# Patient Record
Sex: Female | Born: 1965 | Hispanic: No | Marital: Married | Smoking: Never smoker
Health system: Southern US, Community
[De-identification: ages and names within clinical notes are randomized; demographics above are authoritative.]

## PROBLEM LIST (undated history)

## (undated) DIAGNOSIS — I10 Essential (primary) hypertension: Secondary | ICD-10-CM

## (undated) DIAGNOSIS — K219 Gastro-esophageal reflux disease without esophagitis: Secondary | ICD-10-CM

## (undated) DIAGNOSIS — L210 Seborrhea capitis: Secondary | ICD-10-CM

## (undated) DIAGNOSIS — L659 Nonscarring hair loss, unspecified: Secondary | ICD-10-CM

## (undated) DIAGNOSIS — E785 Hyperlipidemia, unspecified: Secondary | ICD-10-CM

## (undated) HISTORY — DX: Essential (primary) hypertension: I10

## (undated) HISTORY — DX: Hyperlipidemia, unspecified: E78.5

## (undated) HISTORY — DX: Nonscarring hair loss, unspecified: L65.9

## (undated) HISTORY — DX: Gastro-esophageal reflux disease without esophagitis: K21.9

---
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
## 2005-11-02 ENCOUNTER — Other Ambulatory Visit: Admission: RE | Admit: 2005-11-02 | Discharge: 2005-11-02 | Payer: Self-pay | Admitting: Obstetrics and Gynecology

## 2007-02-16 ENCOUNTER — Ambulatory Visit (HOSPITAL_COMMUNITY): Admission: RE | Admit: 2007-02-16 | Discharge: 2007-02-16 | Payer: Self-pay | Admitting: Obstetrics & Gynecology

## 2007-07-20 ENCOUNTER — Ambulatory Visit: Payer: Self-pay | Admitting: Obstetrics and Gynecology

## 2007-07-20 ENCOUNTER — Inpatient Hospital Stay (HOSPITAL_COMMUNITY): Admission: AD | Admit: 2007-07-20 | Discharge: 2007-07-20 | Payer: Self-pay | Admitting: Obstetrics and Gynecology

## 2007-07-20 ENCOUNTER — Inpatient Hospital Stay (HOSPITAL_COMMUNITY): Admission: AD | Admit: 2007-07-20 | Discharge: 2007-07-24 | Payer: Self-pay | Admitting: Obstetrics and Gynecology

## 2007-07-21 ENCOUNTER — Encounter: Payer: Self-pay | Admitting: Obstetrics and Gynecology

## 2007-09-05 ENCOUNTER — Ambulatory Visit: Payer: Self-pay | Admitting: Obstetrics & Gynecology

## 2008-07-15 ENCOUNTER — Ambulatory Visit: Payer: Self-pay | Admitting: Family Medicine

## 2008-07-15 LAB — CONVERTED CEMR LAB
BUN: 11 mg/dL (ref 6–23)
CO2: 24 meq/L (ref 19–32)
Calcium: 9.4 mg/dL (ref 8.4–10.5)
Chloride: 104 meq/L (ref 96–112)
Creatinine, Ser: 0.64 mg/dL (ref 0.40–1.20)
Helicobacter Pylori Antibody-IgG: 0.9

## 2008-07-21 ENCOUNTER — Ambulatory Visit: Payer: Self-pay | Admitting: *Deleted

## 2009-01-29 ENCOUNTER — Ambulatory Visit: Payer: Self-pay | Admitting: Family Medicine

## 2009-08-26 ENCOUNTER — Ambulatory Visit (HOSPITAL_COMMUNITY): Admission: RE | Admit: 2009-08-26 | Discharge: 2009-08-26 | Payer: Self-pay | Admitting: Internal Medicine

## 2010-09-01 ENCOUNTER — Ambulatory Visit (HOSPITAL_COMMUNITY): Admission: RE | Admit: 2010-09-01 | Discharge: 2010-09-01 | Payer: Self-pay | Admitting: Internal Medicine

## 2011-02-15 NOTE — Op Note (Signed)
[name], [name]               ACCOUNT NO.:  [id number]   MEDICAL RECORD NO.:  [id number]          PATIENT TYPE:  INP   LOCATION:  9126                          FACILITY:  [facility]   PHYSICIAN:  Lauren Petersen, M.D.     DATE OF BIRTH:  07-21-1966   DATE OF PROCEDURE:  07/20/2007  DATE OF DISCHARGE:  07/20/2007                               OPERATIVE REPORT   PREOP DIAGNOSIS:  1. Pregnancy, [redacted] weeks gestation.  2. Early labor.  3. Cephalopelvic disproportion.  4. Grand multiparity.   POSTOP DIAGNOSIS:  1. Pregnancy, [redacted] weeks gestation.  2. Early labor.  3. Cephalopelvic disproportion.  4. Grand multiparity.   PROCEDURE:  Primary low transverse cervical cesarean section through  Beck vertical skin incision.   SURGEON:  Lauren Petersen.   ASSISTANT:  None.   ANESTHESIA:  Epidural.   COMPLICATIONS:  None.   FINDINGS:  1. Thick meconium.  2. Vertex arrest at the pelvic inlet with left occiput transverse      position of the vertex.  3. Healthy female infant, Apgars 7/9.   INDICATIONS:  A 45 year old gravida 6, para 5 with 4 living children,  all vaginal deliveries, who presented to Labor from admission at 3 p.m.  to arrest of dilation at 9 cm with pushing efforts for greater than 2  hours unsuccessful at bringing the baby below a 0 station with  persistent transverse arrest of the vertex.   DETAILS OF PROCEDURE:  The patient was taken to the operating room,  epidural catheter topped up and patient initially complaining of  discomfort on the left side.  The catheter was withdrawn 1 mL and  Nesacaine used for an additional 5 mL and then we were able to proceed  with surgery.  The procedure had been interesting, made challenging due  to the fact that the patient's external genital cutting from years ago  had resulted in an obscured urethra and technically challenging  placement of the Foley catheter, which seemed to be in place but drained  no urine after 2 hours with the catheter  out.  Therefore, a vertical  lower abdominal incision was made to address access to the bladder and  lower abdominal contents.  A vertical incision was performed without  difficulty.  Peritoneal cavity entered over the uterus with no exposure  to the bowel.  Bladder flap was easily developed after we found out that  the Foley catheter could indeed be pulled up above the vertex.  Bladder  flap was developed on the very edematous lower uterine segment and a  transverse nick made 6 cm above the very edematous anterior cervical lip  and extended laterally and upward.  Fetal vertex was molded and easily  rotated into the incision and expelled using fundal pressure.  There was  generous thick meconium without malodor.  Of note is that the patient  had been on antibiotics of Ancef since 2 a.m.  A temperature of 101.5  was noted at that time.   The cord was clamped after the baby was delivered.  DeLee suctioning had  been performed before  the baby was delivered.  Then the baby was placed  in the care of the Pediatrics and Apgars of 8 and 9 were assigned.  The  cord blood samples were obtained with an arterial sample unable to be  obtained but venous blood gas was obtained, 7.36 on the venous sample  was obtained.  The cord was quite small and the placenta was of normal  size.  The meconium had chronically discolored the membranes.  The  placenta was delivered by Crede uterine massage and sent for pathology  confirmation of meconium discoloration.  The uterus remained boggy and  responded eventually to uterine massage and IV oxytocin with 1000 mL  blood loss estimated.  The uterus was irrigated with saline solution,  closed using running locking 0 chromic one-layer closure with two figure-  of-eight sutures required on the left side of the incision.  Bladder  flap was loosely reapproximated over the uterine incision.  Abdomen was  irrigated.  Lap sponges were removed and the anterior peritoneum  closed  using loose running #2-0 chromic.  The fascia was closed with #0 Vicryl  in a running fashion.  Subcu tissues were irrigated, inspected,  confirmed as hemostatic and reapproximated using continuous running #2-0  plain.  Staple closure of the skin completed the procedure with good  tissue edge reapproximation.   The patient had been given an opportunity to attempt to breastfeed the  baby prior to the baby leaving the area.  She remained comfortable  through the recovery process except when the peritoneum was being  contacted during irrigation and suctioning.  She went to the Recovery  Room in stable condition with EBL 1000 mL.      Lauren Petersen, M.D.      Lauren Petersen, M.D.  Electronically Signed    JVF/MEDQ  D:  07/21/2007  T:  07/22/2007  Job:  [id number]   cc:   Lauren Petersen

## 2011-02-18 NOTE — Discharge Summary (Signed)
NAMEFLOY, Lauren Petersen               ACCOUNT NO.:  [id number]   MEDICAL RECORD NO.:  [id number]          PATIENT TYPE:  INP   LOCATION:  9126                          [facility]ILITY[facility]  [facility]   PHYSICIAN:  Tanya S. Shawnie Pons, M.D.   DATE OF BIRTH:  1966/08/08   DATE OF ADMISSION:  07/20/2007  DATE OF DISCHARGE:  07/24/2007                               DISCHARGE SUMMARY   __________   ADMISSION DIAGNOSES:  1. Term pregnancy at 40 weeks of a single intrauterine pregnancy.  2. Spontaneous onset of labor.   DISCHARGE DIAGNOSES:  1. Term pregnancy at 40 weeks delivered by cesarean section secondary      to failure to progress.  2. Healthy female newborn.   SERVICE:  [facility].   ATTENDING PHYSICIAN:  Tinnie Gens.   CONSULTS:  None.   PROCEDURES:  1. Epidural placement.  2. Primary lower transverse cervical C-section through a  vertical      skin incision by Dr. Emelda Fear.  C-section was done secondary to      arrest of dilation at 9 cm and persistent transverse arrest.      Procedure was complicated because the patient had a history of      external genital cutting years ago which resulted in obscured      urethra and varicosities of the vaginal wall.  Chorioamnionitis was      present at C-section and patient had a temperature of 101.5 at that      time.  Apgar's were 8 and 9 at delivery.  Estimated blood loss was      1000 mL.  Patient and infant were stable following the procedure.   ADMISSION HISTORY:  A 45 year old [place] female presented with  abdominal pain and contractions around 5 p.m. in the evening and was  found to be 4 cm dilated, 80% effaced, and -3 station.  Patient was  admitted for expectant management of labor and amniotomy was performed  which revealed thick meconium stained fluid.  Baby had a reactive fetal  heart tracing on admission.   HOSPITAL COURSE:  Patient was admitted and amniotomy was performed.  Patient was monitored on  electronic monitoring.  Labor course was  complicated by failure to progress and arrest of dilation likely  secondary to cephalopelvic disproportion.  The patient's previous babies  had been around the size of 5 pounds and after delivery this child was  found to be 8 pounds.  Patient had had prior vaginal deliveries for all  her children in [place].  Patient was given multiple efforts at pushing to  see if the cervix could be reduced over the fetal head but this was  determined to not be possible and it was felt prudent to proceed to  cesarean section at that time around 2 or 3 in the morning.   DISCHARGE CONDITION:  Stable.   DISPOSITION:  Discharged to home.   MEDICATIONS:  1. Percocet 5/325 mg 1 tablet p.o. every 4 hours p.r.n. pain.  2. Ibuprofen 600 mg 1 tablet every 6 hours p.r.n. pain.  3. Colace  100 mg p.o. b.i.d. p.r.n. constipation.  4. Prenatal vitamins daily while breastfeeding.   INSTRUCTIONS:  No heavy lifting for 4 weeks.  Pelvic rest for 6 weeks.  Return to clinic or maternity admissions unit if experiencing severe  nausea and vomiting, severe abdominal pain, lightheadedness or  dizziness, shortness of breath.   FOLLOWUP:  1. Patient is to follow up at the [facility]      Department in 6 weeks for postpartum checkup.  2. 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     ______________________________  Obstetrics Resident      Shelbie Proctor. Shawnie Pons, M.D.  Electronically Signed    OR/MEDQ  D:  07/25/2007  T:  07/25/2007  Job:  [id number]   cc:   Tilda Burrow, M.D.  Fax: [phone]   Shelbie Proctor. Shawnie Pons, M.D.

## 2011-07-13 LAB — CBC
HCT: 26.6 — ABNORMAL LOW
Hemoglobin: 13.8
Hemoglobin: 7.7 — CL
Hemoglobin: 8.4 — ABNORMAL LOW
MCHC: 34.4
MCHC: 34.7
MCV: 89.6
MCV: 89.8
RBC: 2.48 — ABNORMAL LOW
RBC: 2.7 — ABNORMAL LOW
RBC: 2.96 — ABNORMAL LOW
RBC: 4.43
RDW: 13.6
WBC: 10.6 — ABNORMAL HIGH
WBC: 12.1 — ABNORMAL HIGH
WBC: 16 — ABNORMAL HIGH
WBC: 8.9

## 2011-09-09 ENCOUNTER — Other Ambulatory Visit (HOSPITAL_COMMUNITY): Payer: Self-pay | Admitting: Physician Assistant

## 2011-09-09 DIAGNOSIS — Z1231 Encounter for screening mammogram for malignant neoplasm of breast: Secondary | ICD-10-CM

## 2011-10-20 ENCOUNTER — Ambulatory Visit (HOSPITAL_COMMUNITY): Payer: Self-pay

## 2011-11-17 ENCOUNTER — Ambulatory Visit (HOSPITAL_COMMUNITY)
Admission: RE | Admit: 2011-11-17 | Discharge: 2011-11-17 | Disposition: A | Discharge status: Home / Self Care | Payer: Self-pay | Source: Ambulatory Visit | Attending: Physician Assistant | Admitting: Physician Assistant

## 2011-11-17 DIAGNOSIS — Z1231 Encounter for screening mammogram for malignant neoplasm of breast: Secondary | ICD-10-CM | POA: Insufficient documentation

## 2012-01-13 ENCOUNTER — Emergency Department (INDEPENDENT_AMBULATORY_CARE_PROVIDER_SITE_OTHER)
Admission: EM | Admit: 2012-01-13 | Discharge: 2012-01-13 | Disposition: A | Discharge status: Home / Self Care | Payer: Self-pay | Source: Home / Self Care | Attending: Emergency Medicine | Admitting: Emergency Medicine

## 2012-01-13 ENCOUNTER — Encounter (HOSPITAL_COMMUNITY): Payer: Self-pay | Admitting: Emergency Medicine

## 2012-01-13 DIAGNOSIS — H6120 Impacted cerumen, unspecified ear: Secondary | ICD-10-CM

## 2012-01-13 MED ORDER — ANTIPYRINE-BENZOCAINE 5.4-1.4 % OT SOLN: 3.0000 [drp] | Freq: Four times a day (QID) | OTIC | Status: AC | PRN

## 2012-01-13 NOTE — ED Notes (Signed)
PT HERE WITH LEFT EAR CLOGGED AND SOME BLOODY DRAINAGE FROM R THAT STARTED YESTERDAY UNRELIEVED BY OTC EAR WAX REMOVAL KIT.DENIES H/A,BLURRY VISION OR DIZZINESS.

## 2012-01-13 NOTE — ED Provider Notes (Signed)
History     CSN: [id number]  Arrival date & time 01/13/12  1604   First MD Initiated Contact with Patient 01/13/12 1714      5:20PM HPI Family member is translating. States that [employer] lost hearing in her left ear 2 days ago. States right ear is beginning to have similar symptoms. Reports a bloody drainage from her right ear as well. Denies fever.  Patient is a 46 y.o. female presenting with plugged ear sensation. The history is provided by a relative. The history is limited by a language barrier.  Ear Fullness This is a new problem. The current episode started 2 days ago. The problem occurs constantly. The problem has been gradually worsening. The symptoms are relieved by nothing. Treatments tried: OTC ear wax removal. The treatment provided no relief.    History reviewed. No pertinent past medical history.  History reviewed. No pertinent past surgical history.  No family history on file.  History  Substance Use Topics  . Smoking status: Never Smoker   . Smokeless tobacco: Not on file  . Alcohol Use: No    OB History    Grav Para Term Preterm Abortions TAB SAB Ect Mult Living                  Review of Systems  HENT: Positive for hearing loss and ear pain.   All other systems reviewed and are negative.    Allergies  Review of patient's allergies indicates no known allergies.  Home Medications   Current Outpatient Rx  Name Route Sig Dispense Refill  . RANITIDINE HCL 150 MG PO TABS Oral Take 150 mg by mouth 2 (two) times daily.      BP 126/70  Pulse 66  Temp(Src) 98.7 F (37.1 C) (Oral)  Resp 12  SpO2 99%  Physical Exam  Vitals reviewed. Constitutional: She is oriented to person, place, and time. Vital signs are normal. She appears well-developed and well-nourished. No distress.  HENT:  Head: Normocephalic and atraumatic.       Bilateral cerumen impactions.   Eyes: Pupils are equal, round, and reactive to light.  Neck: Neck supple.    Pulmonary/Chest: Effort normal.  Neurological: She is alert and oriented to person, place, and time.  Skin: Skin is warm and dry. No rash noted. No erythema. No pallor.  Psychiatric: She has a normal mood and affect. Her behavior is normal.    ED Course  Procedures   MDM   Bilateral cerumen removal. TM reassessed and Normal. Improvement of hearing.       Thomasene Lot, PA-C 01/13/12 1849

## 2012-01-13 NOTE — Discharge Instructions (Signed)
Cerumen Impaction  A cerumen impaction is when the wax in your ear forms a plug. This plug usually causes reduced hearing. Sometimes it also causes an earache or dizziness. Removing a cerumen impaction can be difficult and painful. The wax sticks to the ear canal. The canal is sensitive and bleeds easily. If you try to remove a heavy wax buildup with a cotton tipped swab, you may push it in further.  Irrigation with water, suction, and small ear curettes may be used to clear out the wax. If the impaction is fixed to the skin in the ear canal, ear drops may be needed for a few days to loosen the wax. People who build up a lot of wax frequently can use ear wax removal products available in your local drugstore.  SEEK MEDICAL CARE IF:    You develop an earache, increased hearing loss, or marked dizziness.  Document Released: 10/27/2004 Document Revised: 09/08/2011 Document Reviewed: 12/17/2009  [employer] Patient Information 2012 [employer].

## 2012-01-13 NOTE — ED Provider Notes (Signed)
Medical screening examination/treatment/procedure(s) were performed by non-physician practitioner and as supervising physician I was immediately available for consultation/collaboration.  Leslee Home, M.D.   Reuben Likes, MD 01/13/12 [phone]

## 2012-01-27 ENCOUNTER — Other Ambulatory Visit (HOSPITAL_COMMUNITY): Payer: Self-pay | Admitting: Family Medicine

## 2012-02-01 ENCOUNTER — Other Ambulatory Visit (HOSPITAL_COMMUNITY): Payer: Self-pay

## 2012-02-02 ENCOUNTER — Ambulatory Visit (HOSPITAL_COMMUNITY)
Admission: RE | Admit: 2012-02-02 | Discharge: 2012-02-02 | Disposition: A | Discharge status: Home / Self Care | Payer: Self-pay | Source: Ambulatory Visit | Attending: Family Medicine | Admitting: Family Medicine

## 2012-02-02 DIAGNOSIS — K8020 Calculus of gallbladder without cholecystitis without obstruction: Secondary | ICD-10-CM | POA: Insufficient documentation

## 2012-11-07 ENCOUNTER — Other Ambulatory Visit (HOSPITAL_COMMUNITY)
Admission: RE | Admit: 2012-11-07 | Discharge: 2012-11-07 | Disposition: A | Discharge status: Home / Self Care | Payer: No Typology Code available for payment source | Source: Ambulatory Visit | Attending: Family Medicine | Admitting: Family Medicine

## 2012-11-07 ENCOUNTER — Emergency Department (HOSPITAL_COMMUNITY)
Admission: EM | Admit: 2012-11-07 | Discharge: 2012-11-07 | Disposition: A | Discharge status: Home / Self Care | Payer: No Typology Code available for payment source | Source: Home / Self Care | Attending: Family Medicine | Admitting: Family Medicine

## 2012-11-07 ENCOUNTER — Encounter (HOSPITAL_COMMUNITY): Payer: Self-pay

## 2012-11-07 DIAGNOSIS — Z202 Contact with and (suspected) exposure to infections with a predominantly sexual mode of transmission: Secondary | ICD-10-CM

## 2012-11-07 DIAGNOSIS — N760 Acute vaginitis: Secondary | ICD-10-CM

## 2012-11-07 DIAGNOSIS — N342 Other urethritis: Secondary | ICD-10-CM

## 2012-11-07 DIAGNOSIS — A749 Chlamydial infection, unspecified: Secondary | ICD-10-CM

## 2012-11-07 DIAGNOSIS — Z113 Encounter for screening for infections with a predominantly sexual mode of transmission: Secondary | ICD-10-CM | POA: Insufficient documentation

## 2012-11-07 LAB — POCT URINALYSIS DIP (DEVICE)
Bilirubin Urine: NEGATIVE
Glucose, UA: NEGATIVE mg/dL
Hgb urine dipstick: NEGATIVE
Specific Gravity, Urine: 1.015 (ref 1.005–1.030)
pH: 7 (ref 5.0–8.0)

## 2012-11-07 MED ORDER — AZITHROMYCIN 250 MG PO TABS: ORAL_TABLET | ORAL | Status: DC

## 2012-11-07 MED ORDER — METRONIDAZOLE 0.75 % VA GEL: 1.0000 | Freq: Two times a day (BID) | VAGINAL | Status: DC

## 2012-11-07 NOTE — ED Provider Notes (Signed)
History     CSN: [id number]  Arrival date & time 11/07/12  1559   First MD Initiated Contact with Patient 11/07/12 1620     Chief Complaint  Patient presents with  . Urinary Tract Infection   (Consider location/radiation/quality/duration/timing/severity/associated sxs/prior treatment) The history is provided by the patient. The history is limited by a language barrier. A language interpreter was used.   Pt came in because husband was diagnosed with chlamydia and pt presents with wife today because she has had some symptoms.  She has been having some vaginal discomfort and discharge.  She has had unprotected sexual intercourse with husband who was recently diagnosed with chlamydia.    History reviewed. No pertinent past medical history.  History reviewed. No pertinent past surgical history.  No family history on file.  History  Substance Use Topics  . Smoking status: Never Smoker   . Smokeless tobacco: Not on file  . Alcohol Use: No    OB History    Grav Para Term Preterm Abortions TAB SAB Ect Mult Living                 Review of Systems  Constitutional: Negative.   HENT: Negative.   Eyes: Negative.   Cardiovascular: Negative.   Gastrointestinal: Negative.   Genitourinary: Positive for vaginal discharge.  Musculoskeletal: Negative.   Skin: Negative.   Neurological: Negative.   Hematological: Negative.   Psychiatric/Behavioral: Negative.   All other systems reviewed and are negative.    Allergies  Review of patient's allergies indicates no known allergies.  Home Medications   Current Outpatient Rx  Name  Route  Sig  Dispense  Refill  . RANITIDINE HCL 150 MG PO TABS   Oral   Take 150 mg by mouth 2 (two) times daily.           BP 101/67  Pulse 66  Temp 98.2 F (36.8 C) (Oral)  Resp 17  SpO2 100%  Physical Exam  Nursing note and vitals reviewed. Constitutional: She is oriented to person, place, and time. She appears well-developed and  well-nourished. No distress.  HENT:  Head: Normocephalic and atraumatic.  Eyes: Conjunctivae normal are normal. Pupils are equal, round, and reactive to light.  Neck: Normal range of motion. Neck supple.  Cardiovascular: Normal rate, regular rhythm and normal heart sounds.   Pulmonary/Chest: Effort normal.  Abdominal: Soft.  Neurological: She is alert and oriented to person, place, and time.  Skin: Skin is warm and dry.  Psychiatric: She has a normal mood and affect. Her behavior is normal. Judgment and thought content normal.    ED Course  Procedures (including critical care time)  Labs Reviewed - No data to display No results found.   No diagnosis found.  MDM  IMPRESSION  Chlamydia exposure  Chlamydia vaginal infection  Possible BV  RECOMMENDATIONS / PLAN Azithromycin 1  Gm po x 1 dose Metronidazole vaginal bid x 1 week I ordered a urinalysis and ancillary urine studies for GC/CHL, BV, yeast and trich Follow up on results  Pt strongly advised to RTC if no improvement   FOLLOW UP 1 month   The patient was given clear instructions to go to ER or return to medical center if symptoms don't improve, worsen or new problems develop.  The patient verbalized understanding.  The patient was told to call to get lab results if they haven't heard anything in the next week.            Larenz Frasier  Cyndie Mull, MD 11/07/12 [phone]

## 2012-11-07 NOTE — ED Notes (Signed)
Patient complains of vaginal area issues usually after having her menses

## 2012-11-12 ENCOUNTER — Encounter (HOSPITAL_COMMUNITY): Payer: Self-pay

## 2012-11-12 NOTE — Progress Notes (Signed)
Quick Note:  Please notify patient that STD tests came back negative.   Rodney Langton, MD, CDE, FAAFP [facility] [place], [place]   ______

## 2012-11-14 NOTE — Progress Notes (Signed)
Quick Note:  Please notify patient that the yeast and bacterial vaginosis tests came back negative.    Rodney Langton, MD, CDE, FAAFP [facility] [place], [place]   ______

## 2012-11-16 ENCOUNTER — Other Ambulatory Visit: Payer: Self-pay | Admitting: Family Medicine

## 2012-11-16 DIAGNOSIS — Z1231 Encounter for screening mammogram for malignant neoplasm of breast: Secondary | ICD-10-CM

## 2012-11-19 ENCOUNTER — Telehealth (HOSPITAL_COMMUNITY): Payer: Self-pay

## 2012-11-19 NOTE — Telephone Encounter (Signed)
Message copied by Lestine Mount on Mon Nov 19, 2012  9:42 AM ------      Message from: Cleora Fleet      Created: Wed Nov 14, 2012 10:17 AM       Please notify patient that the yeast and bacterial vaginosis tests came back negative.                   Rodney Langton, MD, CDE, FAAFP      [facility]      [facility]      [place], [place]        ------

## 2012-11-22 ENCOUNTER — Ambulatory Visit (HOSPITAL_COMMUNITY)
Admission: RE | Admit: 2012-11-22 | Discharge: 2012-11-22 | Disposition: A | Discharge status: Home / Self Care | Payer: No Typology Code available for payment source | Source: Ambulatory Visit | Attending: Family Medicine | Admitting: Family Medicine

## 2012-11-22 DIAGNOSIS — Z1231 Encounter for screening mammogram for malignant neoplasm of breast: Secondary | ICD-10-CM | POA: Insufficient documentation

## 2013-04-02 ENCOUNTER — Ambulatory Visit: Payer: No Typology Code available for payment source | Attending: Family Medicine | Admitting: Family Medicine

## 2013-04-02 VITALS — BP 143/80 | HR 73 | Temp 99.8°F | Resp 16 | Ht 60.0 in | Wt 125.6 lb

## 2013-04-02 DIAGNOSIS — J020 Streptococcal pharyngitis: Secondary | ICD-10-CM | POA: Insufficient documentation

## 2013-04-02 DIAGNOSIS — J019 Acute sinusitis, unspecified: Secondary | ICD-10-CM

## 2013-04-02 DIAGNOSIS — R5381 Other malaise: Secondary | ICD-10-CM | POA: Insufficient documentation

## 2013-04-02 DIAGNOSIS — J029 Acute pharyngitis, unspecified: Secondary | ICD-10-CM

## 2013-04-02 DIAGNOSIS — R5383 Other fatigue: Secondary | ICD-10-CM

## 2013-04-02 MED ORDER — NAPROXEN 500 MG PO TABS: ORAL_TABLET | ORAL | Status: DC

## 2013-04-02 MED ORDER — AMOXICILLIN 875 MG PO TABS: 875.0000 mg | ORAL_TABLET | Freq: Two times a day (BID) | ORAL | Status: DC

## 2013-04-02 NOTE — Progress Notes (Signed)
Patient ID: Lauren Petersen, female   DOB: [birth year], 47 y.o.   MRN: [id number]  CC:  Sore throat  HPI: Pt reports that she has been having a sore throat for 3 days.  She has been having fever, body aches, and headaches.  The headaches have been very strong.  Pt says that she has never had strep throat before but is having some abdominal pain. Pt is having sinus pressure and drainage.    No Known Allergies No past medical history on file. Current Outpatient Prescriptions on File Prior to Visit  Medication Sig Dispense Refill  . azithromycin (ZITHROMAX) 250 MG tablet Take 4 tabs po x 1 dose  4 each  0  . metroNIDAZOLE (METROGEL) 0.75 % vaginal gel Place 1 Applicatorful vaginally 2 (two) times daily.  70 g  0  . [DISCONTINUED] ranitidine (ZANTAC) 150 MG tablet Take 150 mg by mouth 2 (two) times daily.       No current facility-administered medications on file prior to visit.   No family history on file. History   Social History  . Marital Status: Married    Spouse Name: N/A    Number of Children: N/A  . Years of Education: N/A   Occupational History  . Not on file.   Social History Main Topics  . Smoking status: Never Smoker   . Smokeless tobacco: Not on file  . Alcohol Use: No  . Drug Use: No  . Sexually Active:    Other Topics Concern  . Not on file   Social History Narrative  . No narrative on file    Review of Systems  Constitutional: Negative for fever, chills, diaphoresis, activity change, appetite change and fatigue.  HENT: Negative for ear pain, nosebleeds, congestion, facial swelling, rhinorrhea, neck pain, neck stiffness and ear discharge.   Eyes: Negative for pain, discharge, redness, itching and visual disturbance.  Respiratory: Negative for cough, choking, chest tightness, shortness of breath, wheezing and stridor.   Cardiovascular: Negative for chest pain, palpitations and leg swelling.  Gastrointestinal: Negative for abdominal distention.  Genitourinary:  Negative for dysuria, urgency, frequency, hematuria, flank pain, decreased urine volume, difficulty urinating and dyspareunia.  Musculoskeletal: Negative for back pain, joint swelling, arthralgias and gait problem.  Neurological: Negative for dizziness, tremors, seizures, syncope, facial asymmetry, speech difficulty, weakness, light-headedness, numbness and headaches.  Hematological: Negative for adenopathy. Does not bruise/bleed easily.  Psychiatric/Behavioral: Negative for hallucinations, behavioral problems, confusion, dysphoric mood, decreased concentration and agitation.    Objective:   Filed Vitals:   04/02/13 1555  BP: 143/80  Pulse: 73  Temp: 99.8 F (37.7 C)  Resp: 16    Physical Exam  Constitutional: Appears well-developed and well-nourished. No distress.  HENT: Normocephalic. External right and left ear normal. Oropharynx is clear and moist.  Eyes: Conjunctivae and EOM are normal. PERRLA, no scleral icterus.  Neck: Normal ROM. Neck supple. No JVD. No tracheal deviation. No thyromegaly.  CVS: RRR, S1/S2 +, no murmurs, no gallops, no carotid bruit.  Pulmonary: Effort and breath sounds normal, no stridor, rhonchi, wheezes, rales.  Abdominal: Soft. BS +,  no distension, tenderness, rebound or guarding.  Musculoskeletal: Normal range of motion. No edema and no tenderness.  Lymphadenopathy: No lymphadenopathy noted, cervical, inguinal. Neuro: Alert. Normal reflexes, muscle tone coordination. No cranial nerve deficit. Skin: Skin is warm and dry. No rash noted. Not diaphoretic. No erythema. No pallor.  Psychiatric: Normal mood and affect. Behavior, judgment, thought content normal.   Lab Results  Component  Value Date   WBC 8.9 07/23/2007   HGB 8.4* 07/23/2007   HCT 24.2* 07/23/2007   MCV 89.6 07/23/2007   PLT 187 DELTA CHECK NOTED 07/23/2007   Lab Results  Component Value Date   CREATININE 0.64 07/15/2008   BUN 11 07/15/2008   NA 139 07/15/2008   K 4.0 07/15/2008   CL  104 07/15/2008   CO2 24 07/15/2008    No results found for this basename: HGBA1C   Lipid Panel  No results found for this basename: chol, trig, hdl, cholhdl, vldl, ldlcalc       Assessment and plan:   There are no active problems to display for this patient.  Sore throat  Check a rapid strep test today in office. Amoxil 875 mg po bid  Naproxen 500 mg po every 12 hours prn sore throat  RTC as needed  The patient was given clear instructions to go to ER or return to medical center if symptoms don't improve, worsen or new problems develop.  The patient verbalized understanding.  The patient was told to call to get any lab results if not heard anything in the next week.    Rodney Langton, MD, CDE, FAAFP [facility] [place], [place]

## 2013-04-02 NOTE — Patient Instructions (Addendum)
Fever   Fever is a higher-than-normal body temperature. A normal temperature varies with:   Age.   How it is measured (mouth, underarm, rectal, or ear).   Time of day.  In an adult, an oral temperature around 98.6 Fahrenheit (F) or 37 Celsius (C) is considered normal. A rise in temperature of about 1.8 F or 1 C is generally considered a fever (100.4 F or 38 C). In an infant age 45 days or less, a rectal temperature of 100.4 F (38 C) generally is regarded as fever. Fever is not a disease but can be a symptom of illness.  CAUSES    Fever is most commonly caused by infection.   Some non-infectious problems can cause fever. For example:   Some arthritis problems.   Problems with the thyroid or adrenal glands.   Immune system problems.   Some kinds of cancer.   A reaction to certain medicines.   Occasionally, the source of a fever cannot be determined. This is sometimes called a "Fever of Unknown Origin" (FUO).   Some situations may lead to a temporary rise in body temperature that may go away on its own. Examples are:   Childbirth.   Surgery.   Some situations may cause a rise in body temperature but these are not considered "true fever". Examples are:   Intense exercise.   Dehydration.   Exposure to high outside or room temperatures.  SYMPTOMS    Feeling warm or hot.   Fatigue or feeling exhausted.   Aching all over.   Chills.   Shivering.   Sweats.  DIAGNOSIS   A fever can be suspected by your caregiver feeling that your skin is unusually warm. The fever is confirmed by taking a temperature with a thermometer. Temperatures can be taken different ways. Some methods are accurate and some are not:  With adults, adolescents, and children:    An oral temperature is used most commonly.   An ear thermometer will only be accurate if it is positioned as recommended by the manufacturer.   Under the arm temperatures are not accurate and not recommended.   Most electronic thermometers are fast  and accurate.  Infants and Toddlers:   Rectal temperatures are recommended and most accurate.   Ear temperatures are not accurate in this age group and are not recommended.   Skin thermometers are not accurate.  RISKS AND COMPLICATIONS    During a fever, the body uses more oxygen, so a person with a fever may develop rapid breathing or shortness of breath. This can be dangerous especially in people with heart or lung disease.   The sweats that occur following a fever can cause dehydration.   High fever can cause seizures in infants and children.   Older persons can develop confusion during a fever.  TREATMENT    Medications may be used to control temperature.   Do not give aspirin to children with fevers. There is an association with Reye's syndrome. Reye's syndrome is a rare but potentially deadly disease.   If an infection is present and medications have been prescribed, take them as directed. Finish the full course of medications until they are gone.   Sponging or bathing with room-temperature water may help reduce body temperature. Do not use ice water or alcohol sponge baths.   Do not over-bundle children in blankets or heavy clothes.   Drinking adequate fluids during an illness with fever is important to prevent dehydration.  HOME CARE INSTRUCTIONS      help with comfort. The amount to be given is based on the child's weight. Do NOT give more than is recommended. SEEK MEDICAL CARE IF:   You or your child are unable to keep fluids down.  Vomiting or diarrhea develops.  You develop a skin rash.  An oral temperature above 102 F (38.9 C) develops, or a fever which persists for over 3  days.  You develop excessive weakness, dizziness, fainting or extreme thirst.  Fevers keep coming back after 3 days. SEEK IMMEDIATE MEDICAL CARE IF:   Shortness of breath or trouble breathing develops  You pass out.  You feel you are making little or no urine.  New pain develops that was not there before (such as in the head, neck, chest, back, or abdomen).  You cannot hold down fluids.  Vomiting and diarrhea persist for more than a day or two.  You develop a stiff neck and/or your eyes become sensitive to light.  An unexplained temperature above 102 F (38.9 C) develops. Document Released: 09/19/2005 Document Revised: 12/12/2011 Document Reviewed: 09/04/2008 [facility] Patient Information 2014 [place], [place]. Sore Throat A sore throat is pain, burning, irritation, or scratchiness of the throat. There is often pain or tenderness when swallowing or talking. A sore throat may be accompanied by other symptoms, such as coughing, sneezing, fever, and swollen neck glands. A sore throat is often the first sign of another sickness, such as a cold, flu, strep throat, or mononucleosis (commonly known as mono). Most sore throats go away without medical treatment. CAUSES  The most common causes of a sore throat include:  A viral infection, such as a cold, flu, or mono.  A bacterial infection, such as strep throat, tonsillitis, or whooping cough.  Seasonal allergies.  Dryness in the air.  Irritants, such as smoke or pollution.  Gastroesophageal reflux disease (GERD). HOME CARE INSTRUCTIONS   Only take over-the-counter medicines as directed by your caregiver.  Drink enough fluids to keep your urine clear or pale yellow.  Rest as needed.  Try using throat sprays, lozenges, or sucking on hard candy to ease any pain (if older than 4 years or as directed).  Sip warm liquids, such as broth, herbal tea, or warm water with honey to relieve pain temporarily. You may also eat or drink cold  or frozen liquids such as frozen ice pops.  Gargle with salt water (mix 1 tsp salt with 8 oz of water).  Do not smoke and avoid secondhand smoke.  Put a cool-mist humidifier in your bedroom at night to moisten the air. You can also turn on a hot shower and sit in the bathroom with the door closed for 5 10 minutes. SEEK IMMEDIATE MEDICAL CARE IF:  You have difficulty breathing.  You are unable to swallow fluids, soft foods, or your saliva.  You have increased swelling in the throat.  Your sore throat does not get better in 7 days.  You have nausea and vomiting.  You have a fever or persistent symptoms for more than 2 3 days.  You have a fever and your symptoms suddenly get worse. MAKE SURE YOU:   Understand these instructions.  Will watch your condition.  Will get help right away if you are not doing well or get worse. Document Released: 10/27/2004 Document Revised: 09/05/2012 Document Reviewed: 05/27/2012 [facility] Patient Information 2014 [place], [place].

## 2013-04-02 NOTE — Progress Notes (Signed)
Pt here with c/o sore throat,pain with swallowing,h/a and bilat neck pain radiating down to back x 3 dys. Reports of feeling warm yesterday relieved by tylenol. X 1 episode of vomiting this am. Temp 99.8.redness noted back of throat.

## 2013-07-18 ENCOUNTER — Ambulatory Visit: Payer: No Typology Code available for payment source | Attending: Internal Medicine | Admitting: Internal Medicine

## 2013-07-18 ENCOUNTER — Encounter: Payer: Self-pay | Admitting: Internal Medicine

## 2013-07-18 VITALS — BP 135/79 | HR 58 | Temp 98.5°F | Resp 16 | Ht 60.0 in | Wt 130.0 lb

## 2013-07-18 DIAGNOSIS — K029 Dental caries, unspecified: Secondary | ICD-10-CM | POA: Insufficient documentation

## 2013-07-18 DIAGNOSIS — K0889 Other specified disorders of teeth and supporting structures: Secondary | ICD-10-CM

## 2013-07-18 DIAGNOSIS — K089 Disorder of teeth and supporting structures, unspecified: Secondary | ICD-10-CM

## 2013-07-18 NOTE — Progress Notes (Signed)
Patient ID: Lauren Petersen, female   DOB: 01/24/1966, 47 y.o.   MRN: [id number]   HPI 47 year old female here with [place] interpreter complaining of pain in her teeth diffusely with irritation to gums with hot or cold foods and on brushing. Reports occasions bleeding on brushing. Denies fever, chills or foul breath. Denies difficulty swallowing or jaw swelling.   Vital signs in last 24 hours:  Filed Vitals:   07/18/13 1006  BP: 135/79  Pulse: 58  Temp: 98.5 F (36.9 C)  TempSrc: Oral  Resp: 16  Height: 5' (1.524 m)  Weight: 130 lb (58.968 kg)  SpO2: 100%        Physical Exam:  General: Middle aged female in no acute distress. HEENT: no pallor, no icterus, moist oral mucosa, no JVD, no lymphadenopathy, dental caries b/l, no swelling or abscess, no tonsillar enlargement Heart: Normal  s1 &s2   Lungs: Clear to auscultation bilaterally.    Lab Results:  Basic Metabolic Panel:    Component Value Date/Time   NA 139 07/15/2008 2018   K 4.0 07/15/2008 2018   CL 104 07/15/2008 2018   CO2 24 07/15/2008 2018   BUN 11 07/15/2008 2018   CREATININE 0.64 07/15/2008 2018   GLUCOSE 96 07/15/2008 2018   CALCIUM 9.4 07/15/2008 2018   CBC:    Component Value Date/Time   WBC 8.9 07/23/2007 1149   HGB 8.4* 07/23/2007 1149   HCT 24.2* 07/23/2007 1149   PLT 187 DELTA CHECK NOTED 07/23/2007 1149   MCV 89.6 07/23/2007 1149    No results found for this or any previous visit (from the past 240 hour(s)).  Studies/Results: No results found.  Medications: Scheduled Meds: Continuous Infusions: PRN Meds:.    Assessment/Plan:  Dental caries and gingivitis Will refer to dental clinic for evaluation and cleaning  recommend using soft bristle toothbrush to avoid irritation to gums  F/up as needed  Lauren Petersen 07/18/2013, 10:38 AM

## 2013-07-18 NOTE — Progress Notes (Signed)
Pt is here for a follow up visit. Pt reports having pain in her mouth. She thinks that she has several cavity's and it has become very uncomfortable to eat, drink or brush her teeth.

## 2013-08-02 ENCOUNTER — Ambulatory Visit: Payer: No Typology Code available for payment source | Attending: Internal Medicine | Admitting: Internal Medicine

## 2013-08-02 VITALS — BP 149/80 | HR 71 | Temp 98.0°F | Resp 16

## 2013-08-02 DIAGNOSIS — K219 Gastro-esophageal reflux disease without esophagitis: Secondary | ICD-10-CM

## 2013-08-02 DIAGNOSIS — R1013 Epigastric pain: Secondary | ICD-10-CM | POA: Insufficient documentation

## 2013-08-02 MED ORDER — OMEPRAZOLE 40 MG PO CPDR: 40.0000 mg | DELAYED_RELEASE_CAPSULE | Freq: Every day | ORAL | Status: DC

## 2013-08-02 NOTE — Progress Notes (Signed)
Patient complains of pain to left side for a long time Feels bloated and gassy at times Has and [place] back at [facility] which did not reveal anything Has not been to a specialist for this

## 2013-08-02 NOTE — Progress Notes (Signed)
Patient Demographics  Lauren Petersen, is a 47 y.o. female  [id number]  [id number]:[id number]  DOB - 21-Apr-1966  Chief Complaint  Patient presents with  . Bloated        Subjective:   Lauren Petersen today is here for a follow up visit. Patient is a 47 year old [place] female without major as medical history. She complains of left upper abdominal pain, epigastric pain and occasional reflux-like symptoms that cause her to have chest pain. She also complains of a lot of flatulence at times. She has no weight loss, no nausea vomiting. Apparently, this has been going on intermittently for the past 5 years and has worsened over the past 2 months. Please note that this history is obtained from the official translator who is with the patient.  Patient has No headache, No chest pain, No abdominal pain - No Nausea, No new weakness tingling or numbness, No Cough - SOB.  Objective:    Filed Vitals:   08/02/13 1636  BP: 149/80  Pulse: 71  Temp: 98 F (36.7 C)  Resp: 16  SpO2: 100%     ALLERGIES:  No Known Allergies  PAST MEDICAL HISTORY: No past medical history on file.  MEDICATIONS AT HOME: Prior to Admission medications   Medication Sig Start Date End Date Taking? Authorizing Provider  amoxicillin (AMOXIL) 875 MG tablet Take 1 tablet (875 mg total) by mouth 2 (two) times daily. 04/02/13   Clanford Cyndie Mull, MD  naproxen (NAPROSYN) 500 MG tablet Take 1 po every 12 hours with food prn sore throat 04/02/13   Clanford Cyndie Mull, MD  omeprazole (PRILOSEC) 40 MG capsule Take 1 capsule (40 mg total) by mouth daily. 08/02/13   Nada Godley Levora Dredge, MD     Exam  General appearance :Awake, alert, not in any distress. Speech Clear. Not toxic Looking HEENT: Atraumatic and Normocephalic, pupils equally reactive to light and accomodation Neck: supple, no JVD. No cervical lymphadenopathy.  Chest:Good air entry bilaterally, no added sounds  CVS: S1 S2 regular, no murmurs.  Abdomen: Bowel sounds  present, Non tender and not distended with no gaurding, rigidity or rebound. Extremities: B/L Lower Ext shows no edema, both legs are warm to touch Neurology: Awake alert, and oriented X 3, CN II-XII intact, Non focal Skin:No Rash Wounds:N/A    Data Review   CBC No results found for this basename: WBC, HGB, HCT, PLT, MCV, MCH, MCHC, RDW, NEUTRABS, LYMPHSABS, MONOABS, EOSABS, BASOSABS, BANDABS, BANDSABD,  in the last 168 hours  Chemistries   No results found for this basename: NA, K, CL, CO2, GLUCOSE, BUN, CREATININE, GFRCGP, CALCIUM, MG, AST, ALT, ALKPHOS, BILITOT,  in the last 168 hours ------------------------------------------------------------------------------------------------------------------ No results found for this basename: HGBA1C,  in the last 72 hours ------------------------------------------------------------------------------------------------------------------ No results found for this basename: CHOL, HDL, LDLCALC, TRIG, CHOLHDL, LDLDIRECT,  in the last 72 hours ------------------------------------------------------------------------------------------------------------------ No results found for this basename: TSH, T4TOTAL, FREET3, T3FREE, THYROIDAB,  in the last 72 hours ------------------------------------------------------------------------------------------------------------------ No results found for this basename: VITAMINB12, FOLATE, FERRITIN, TIBC, IRON, RETICCTPCT,  in the last 72 hours  Coagulation profile  No results found for this basename: INR, PROTIME,  in the last 168 hours    Assessment & Plan   Suspected GERD - Trial of PPI - If no improvement in one month, will need either a GI referral or a CT scan of the abdomen - Please note, an ultrasound of the abdomen done in May of 2013 did not show any major acute abnormalities except  for gallstones.  Followup in one month  I will order regular blood work to be done prior to her next visit, please  follow   Health Maintenance -Pap Smear:refer to GYN  -Mammogram: Have ordered   The patient was given clear instructions to go to ER or return to medical center if symptoms don't improve, worsen or new problems develop. The patient verbalized understanding. The patient was told to call to get lab results if they haven't heard anything in the next week.

## 2013-08-07 ENCOUNTER — Other Ambulatory Visit: Payer: Self-pay | Admitting: Internal Medicine

## 2013-09-11 ENCOUNTER — Encounter: Payer: Self-pay | Admitting: Internal Medicine

## 2013-09-11 ENCOUNTER — Ambulatory Visit: Payer: No Typology Code available for payment source | Attending: Internal Medicine | Admitting: Internal Medicine

## 2013-09-11 VITALS — BP 154/81 | HR 71 | Temp 99.1°F | Resp 16 | Ht 60.0 in | Wt 133.0 lb

## 2013-09-11 DIAGNOSIS — R5381 Other malaise: Secondary | ICD-10-CM

## 2013-09-11 DIAGNOSIS — R143 Flatulence: Secondary | ICD-10-CM | POA: Insufficient documentation

## 2013-09-11 DIAGNOSIS — K219 Gastro-esophageal reflux disease without esophagitis: Secondary | ICD-10-CM

## 2013-09-11 LAB — CBC WITH DIFFERENTIAL/PLATELET
Eosinophils Relative: 4 % (ref 0–5)
HCT: 34.4 % — ABNORMAL LOW (ref 36.0–46.0)
Hemoglobin: 11.1 g/dL — ABNORMAL LOW (ref 12.0–15.0)
Lymphocytes Relative: 49 % — ABNORMAL HIGH (ref 12–46)
Lymphs Abs: 2.9 10*3/uL (ref 0.7–4.0)
MCV: 73.3 fL — ABNORMAL LOW (ref 78.0–100.0)
Monocytes Absolute: 0.3 10*3/uL (ref 0.1–1.0)
Monocytes Relative: 4 % (ref 3–12)
RBC: 4.69 MIL/uL (ref 3.87–5.11)
WBC: 5.9 10*3/uL (ref 4.0–10.5)

## 2013-09-11 LAB — COMPREHENSIVE METABOLIC PANEL
ALT: 11 U/L (ref 0–35)
Albumin: 4.2 g/dL (ref 3.5–5.2)
BUN: 10 mg/dL (ref 6–23)
CO2: 26 mEq/L (ref 19–32)
Calcium: 9.5 mg/dL (ref 8.4–10.5)
Chloride: 103 mEq/L (ref 96–112)
Creat: 0.81 mg/dL (ref 0.50–1.10)
Glucose, Bld: 92 mg/dL (ref 70–99)
Total Bilirubin: 0.2 mg/dL — ABNORMAL LOW (ref 0.3–1.2)

## 2013-09-11 LAB — LIPID PANEL
Cholesterol: 158 mg/dL (ref 0–200)
HDL: 53 mg/dL (ref >39)
Total CHOL/HDL Ratio: 3 Ratio

## 2013-09-11 MED ORDER — OMEPRAZOLE 40 MG PO CPDR: 40.0000 mg | DELAYED_RELEASE_CAPSULE | Freq: Every day | ORAL | Status: DC

## 2013-09-11 MED ORDER — SIMETHICONE 80 MG PO CHEW: 80.0000 mg | CHEWABLE_TABLET | Freq: Four times a day (QID) | ORAL | Status: DC | PRN

## 2013-09-11 MED ORDER — SENNOSIDES-DOCUSATE SODIUM 8.6-50 MG PO TABS: 1.0000 | ORAL_TABLET | Freq: Every evening | ORAL | Status: DC | PRN

## 2013-09-11 NOTE — Progress Notes (Unsigned)
Pt is here following up on her occasional pain on her left side. Pt reports of having frequent gas.

## 2013-09-11 NOTE — Progress Notes (Unsigned)
Patient ID: Lauren Petersen, female   DOB: 1966/07/27, 47 y.o.   MRN: [id number] Patient Demographics  Lauren Petersen, is a 47 y.o. female  [id number]  [id number]  DOB - 1966/01/06  Chief Complaint  Patient presents with  . Follow-up        Subjective:   Lauren Petersen is here for a follow up visit. Patient is a 47 year old female presented with nonspecific left sided flank pain that worsens on lying down on left side. Otherwise patient denies any abdominal pain during the day, nausea, vomiting, hematochezia or melena. She occasionally has constipation.   Patient has No headache, No chest pain No new weakness tingling or numbness, No Cough - SOB.   Objective:    Filed Vitals:   09/11/13 1701  BP: 154/81  Pulse: 71  Temp: 99.1 F (37.3 C)  TempSrc: Oral  Resp: 16  Height: 5' (1.524 m)  Weight: 133 lb (60.328 kg)  SpO2: 100%     ALLERGIES:  No Known Allergies  PAST MEDICAL HISTORY: History reviewed. No pertinent past medical history.  MEDICATIONS AT HOME: Prior to Admission medications   Medication Sig Start Date End Date Taking? Authorizing Provider  amoxicillin (AMOXIL) 875 MG tablet Take 1 tablet (875 mg total) by mouth 2 (two) times daily. 04/02/13  Yes Clanford Cyndie Mull, MD  naproxen (NAPROSYN) 500 MG tablet Take 1 po every 12 hours with food prn sore throat 04/02/13  Yes Clanford Cyndie Mull, MD  omeprazole (PRILOSEC) 40 MG capsule Take 1 capsule (40 mg total) by mouth at bedtime. 09/11/13  Yes Grete Bosko Jenna Luo, MD  simethicone (GAS-X) 80 MG chewable tablet Chew 1 tablet (80 mg total) by mouth every 6 (six) hours as needed for flatulence (take one tab at bed time everynight). 09/11/13   Katherine Tout Jenna Luo, MD     Exam  General appearance :Awake, alert, NAD, Speech Clear.  HEENT: Atraumatic and Normocephalic, PERLA Neck: supple, no JVD. No cervical lymphadenopathy.  Chest: Clear to auscultation bilaterally, no wheezing, rales or rhonchi CVS: S1 S2 regular,  no murmurs.  Abdomen: soft, NBS, ND, no gaurding, rigidity or rebound. Nontender on exam Extremities: no cyanosis or clubbing, B/L Lower Ext shows no edema Neurology: Awake alert, and oriented X 3, CN II-XII intact, Non focal Skin: No Rash or lesions Wounds:N/A    Data Review   Basic Metabolic Panel: No results found for this basename: NA, K, CL, CO2, GLUCOSE, BUN, CREATININE, CALCIUM, MG, PHOS,  in the last 168 hours Liver Function Tests: No results found for this basename: AST, ALT, ALKPHOS, BILITOT, PROT, ALBUMIN,  in the last 168 hours  CBC: No results found for this basename: WBC, NEUTROABS, HGB, HCT, MCV, PLT,  in the last 168 hours  ------------------------------------------------------------------------------------------------------------------ No results found for this basename: HGBA1C,  in the last 72 hours ------------------------------------------------------------------------------------------------------------------ No results found for this basename: CHOL, HDL, LDLCALC, TRIG, CHOLHDL, LDLDIRECT,  in the last 72 hours ------------------------------------------------------------------------------------------------------------------ No results found for this basename: TSH, T4TOTAL, FREET3, T3FREE, THYROIDAB,  in the last 72 hours ------------------------------------------------------------------------------------------------------------------ No results found for this basename: VITAMINB12, FOLATE, FERRITIN, TIBC, IRON, RETICCTPCT,  in the last 72 hours  Coagulation profile  No results found for this basename: INR, PROTIME,  in the last 168 hours    Assessment & Plan   Active Problems: Vague left flank pain/flatulence - Patient did not have any abdominal tenderness on my examination. We'll continue her Prilosec - Also placed on simethicone as needed and one at  bedtime - CT abdomen pelvis for further workup  Preventive care - She had mammogram this year -  Ambulatory referral to GYN for her Pap smear - Labs ordered CBC, CMET, lipid panel  Recommendations: Follow labs and CT abdomen Follow-up in 2 months, will call earlier if there is any abnormality on the labs or CT     Klaryssa Fauth M.D. 09/11/2013, 5:21 PM

## 2013-09-19 ENCOUNTER — Encounter (HOSPITAL_COMMUNITY): Payer: Self-pay

## 2013-09-19 ENCOUNTER — Ambulatory Visit (HOSPITAL_COMMUNITY)
Admission: RE | Admit: 2013-09-19 | Discharge: 2013-09-19 | Disposition: A | Discharge status: Home / Self Care | Payer: No Typology Code available for payment source | Source: Ambulatory Visit | Attending: Internal Medicine | Admitting: Internal Medicine

## 2013-09-19 DIAGNOSIS — R143 Flatulence: Secondary | ICD-10-CM

## 2013-09-19 DIAGNOSIS — R51 Headache: Secondary | ICD-10-CM | POA: Insufficient documentation

## 2013-09-19 DIAGNOSIS — R109 Unspecified abdominal pain: Secondary | ICD-10-CM | POA: Insufficient documentation

## 2013-09-19 DIAGNOSIS — K219 Gastro-esophageal reflux disease without esophagitis: Secondary | ICD-10-CM

## 2013-09-19 MED ORDER — IOHEXOL 300 MG/ML  SOLN
100.0000 mL | Freq: Once | INTRAMUSCULAR | Status: AC | PRN
  Administered 2013-09-19: 100 mL via INTRAVENOUS

## 2013-09-20 ENCOUNTER — Ambulatory Visit: Payer: Self-pay

## 2013-10-17 ENCOUNTER — Encounter: Payer: Self-pay | Admitting: Obstetrics & Gynecology

## 2013-10-17 ENCOUNTER — Ambulatory Visit (INDEPENDENT_AMBULATORY_CARE_PROVIDER_SITE_OTHER): Payer: No Typology Code available for payment source | Admitting: Obstetrics & Gynecology

## 2013-10-17 VITALS — BP 130/70 | HR 67 | Ht 60.0 in | Wt 127.9 lb

## 2013-10-17 DIAGNOSIS — Z01419 Encounter for gynecological examination (general) (routine) without abnormal findings: Secondary | ICD-10-CM

## 2013-10-17 NOTE — Patient Instructions (Signed)

## 2013-10-17 NOTE — Progress Notes (Signed)
Patient ID: Lauren Petersen, female   DOB: Nov 04, 1965, 48 y.o.   MRN: [id number] Subjective:     Lauren Petersen is a 48 y.o. female here for a routine exam.  Current complaints: none.     Gynecologic History Patient's last menstrual period was 09/20/2013. Contraception: IUD- Paragard x 6 years Last Pap: 2006. Results were: normal Last mammogram: 2014. Results were: normal  Obstetric History OB History  Gravida Para Term Preterm AB SAB TAB Ectopic Multiple Living  5 5 5       5     # Outcome Date GA Lbr Len/2nd Weight Sex Delivery Anes PTL Lv  5 TRM      CS     4 TRM      SVD     3 TRM      SVD     2 TRM      SVD     1 TRM      SVD          The following portions of the patient's history were reviewed and updated as appropriate: allergies, current medications, past family history, past medical history, past social history, past surgical history and problem list.  Review of Systems Pertinent items are noted in HPI.    Objective:    BP 130/70  Pulse 67  Ht 5' (1.524 m)  Wt 127 lb 14.4 oz (58.015 kg)  BMI 24.98 kg/m2  LMP 09/20/2013  General Appearance:    Alert, cooperative, no distress, appears stated age                 Neck:   Supple, symmetrical, trachea midline, no adenopathy;    thyroid:  no enlargement/tenderness/nodules; no carotid   bruit or JVD  Back:     Symmetric, no curvature, ROM normal, no CVA tenderness  Lungs:     Clear to auscultation bilaterally, respirations unlabored  Chest Wall:    No tenderness or deformity   Heart:    Regular rate and rhythm, S1 and S2 normal, no murmur, rub   or gallop  Breast Exam:    No tenderness, masses, or nipple abnormality  Abdomen:     Soft, non-tender, bowel sounds active all four quadrants,    no masses, no organomegaly  Genitalia:    Normal female without lesion, discharge or tenderness     Extremities:   Extremities normal, atraumatic, no cyanosis or edema  Pulses:   2+ and symmetric all extremities  Skin:    Skin color, texture, turgor normal, no rashes or lesions  Lymph nodes:   Cervical, supraclavicular, and axillary nodes normal         Assessment:    Healthy female exam.  No PAP >6 years   Plan:    Mammogram ordered. Follow up in: 1 year.   F/u PAP with HPV

## 2013-10-17 NOTE — Progress Notes (Signed)
Patient reports that last pap was about six years ago.

## 2013-11-12 ENCOUNTER — Ambulatory Visit: Payer: No Typology Code available for payment source | Attending: Internal Medicine | Admitting: Internal Medicine

## 2013-11-12 ENCOUNTER — Encounter: Payer: Self-pay | Admitting: Internal Medicine

## 2013-11-12 VITALS — BP 131/79 | HR 65 | Temp 98.5°F | Resp 16 | Ht 60.0 in | Wt 128.0 lb

## 2013-11-12 DIAGNOSIS — R141 Gas pain: Secondary | ICD-10-CM

## 2013-11-12 DIAGNOSIS — K219 Gastro-esophageal reflux disease without esophagitis: Secondary | ICD-10-CM | POA: Insufficient documentation

## 2013-11-12 DIAGNOSIS — R143 Flatulence: Secondary | ICD-10-CM

## 2013-11-12 DIAGNOSIS — R142 Eructation: Secondary | ICD-10-CM | POA: Insufficient documentation

## 2013-11-12 MED ORDER — OMEPRAZOLE 40 MG PO CPDR: 40.0000 mg | DELAYED_RELEASE_CAPSULE | Freq: Every day | ORAL | Status: DC

## 2013-11-12 MED ORDER — SIMETHICONE 80 MG PO CHEW: 80.0000 mg | CHEWABLE_TABLET | Freq: Four times a day (QID) | ORAL | Status: DC | PRN

## 2013-11-12 NOTE — Progress Notes (Signed)
MRN: [id number] Name: Lauren Petersen  Sex: female Age: 48 y.o. DOB: [birth year]  Allergies: Review of patient's allergies indicates no known allergies.  No chief complaint on file.   HPI: Patient is 48 y.o. female who has history of GERD symptoms flatulence, she was prescribed Prilosec and simethicone in the past, she then out of her medication, sometimes has left lower flank pain, she had a CAT scan done which was negative for any obstructive uropathy, she denies any nausea vomiting any fever chills.  History reviewed. No pertinent past medical history.  Past Surgical History  Procedure Laterality Date  . Cesarean section        Medication List       This list is accurate as of: 11/12/13  5:19 PM.  Always use your most recent med list.               naproxen 500 MG tablet  Commonly known as:  NAPROSYN  Take 1 po every 12 hours with food prn sore throat     omeprazole 40 MG capsule  Commonly known as:  PRILOSEC  Take 1 capsule (40 mg total) by mouth at bedtime.     PARAGARD INTRAUTERINE COPPER IU  1 Intra Uterine Device by Intrauterine route.     senna-docusate 8.6-50 MG per tablet  Commonly known as:  SENOKOT S  Take 1 tablet by mouth at bedtime as needed for mild constipation.     simethicone 80 MG chewable tablet  Commonly known as:  GAS-X  Chew 1 tablet (80 mg total) by mouth every 6 (six) hours as needed for flatulence (take one tab at bed time everynight).        Meds ordered this encounter  Medications  . simethicone (GAS-X) 80 MG chewable tablet    Sig: Chew 1 tablet (80 mg total) by mouth every 6 (six) hours as needed for flatulence (take one tab at bed time everynight).    Dispense:  60 tablet    Refill:  3  . omeprazole (PRILOSEC) 40 MG capsule    Sig: Take 1 capsule (40 mg total) by mouth at bedtime.    Dispense:  30 capsule    Refill:  3     There is no immunization history on file for this patient.  History reviewed. No pertinent  family history.  History  Substance Use Topics  . Smoking status: Never Smoker   . Smokeless tobacco: Not on file  . Alcohol Use: No    Review of Systems   As noted in HPI  Filed Vitals:   11/12/13 1655  BP: 131/79  Pulse: 65  Temp: 98.5 F (36.9 C)  Resp: 16    Physical Exam  Physical Exam  Constitutional: No distress.  Eyes: EOM are normal. Pupils are equal, round, and reactive to light.  Cardiovascular: Normal rate and regular rhythm.   Pulmonary/Chest: Breath sounds normal. No respiratory distress. She has no wheezes. She has no rales.  Musculoskeletal: She exhibits no edema.  No spinal or paraspinal tenderness, SLR test negative     CBC    Component Value Date/Time   WBC 5.9 09/11/2013 1731   RBC 4.69 09/11/2013 1731   HGB 11.1* 09/11/2013 1731   HCT 34.4* 09/11/2013 1731   PLT 253 09/11/2013 1731   MCV 73.3* 09/11/2013 1731   LYMPHSABS 2.9 09/11/2013 1731   MONOABS 0.3 09/11/2013 1731   EOSABS 0.2 09/11/2013 1731   BASOSABS 0.0 09/11/2013 1731  CMP     Component Value Date/Time   NA 137 09/11/2013 1731   K 3.9 09/11/2013 1731   CL 103 09/11/2013 1731   CO2 26 09/11/2013 1731   GLUCOSE 92 09/11/2013 1731   BUN 10 09/11/2013 1731   CREATININE 0.81 09/11/2013 1731   CREATININE 0.64 07/15/2008 2018   CALCIUM 9.5 09/11/2013 1731   PROT 7.7 09/11/2013 1731   ALBUMIN 4.2 09/11/2013 1731   AST 14 09/11/2013 1731   ALT 11 09/11/2013 1731   ALKPHOS 55 09/11/2013 1731   BILITOT 0.2* 09/11/2013 1731    Lab Results  Component Value Date/Time   CHOL 158 09/11/2013  5:31 PM    No components found with this basename: hga1c    Lab Results  Component Value Date/Time   AST 14 09/11/2013  5:31 PM    Assessment and Plan  GERD (gastroesophageal reflux disease) - Plan: Continue with  omeprazole (PRILOSEC) 40 MG capsule  Flatulence - Plan: simethicone (GAS-X) 80 MG chewable tablet   Return in about 4 months (around 03/12/2014).  Lorayne Marek, MD

## 2013-11-12 NOTE — Progress Notes (Signed)
Pt is here following up on her chronic GERD. Pt has an interpretor.

## 2013-11-26 ENCOUNTER — Ambulatory Visit (HOSPITAL_COMMUNITY): Payer: No Typology Code available for payment source

## 2013-12-05 ENCOUNTER — Ambulatory Visit (HOSPITAL_COMMUNITY)
Admission: RE | Admit: 2013-12-05 | Discharge: 2013-12-05 | Disposition: A | Discharge status: Home / Self Care | Payer: No Typology Code available for payment source | Source: Ambulatory Visit | Attending: Obstetrics & Gynecology | Admitting: Obstetrics & Gynecology

## 2013-12-05 DIAGNOSIS — Z01419 Encounter for gynecological examination (general) (routine) without abnormal findings: Secondary | ICD-10-CM

## 2013-12-05 DIAGNOSIS — Z1231 Encounter for screening mammogram for malignant neoplasm of breast: Secondary | ICD-10-CM | POA: Insufficient documentation

## 2014-01-27 ENCOUNTER — Encounter: Payer: Self-pay | Admitting: *Deleted

## 2014-02-10 ENCOUNTER — Ambulatory Visit: Payer: No Typology Code available for payment source | Admitting: *Deleted

## 2014-02-10 VITALS — BP 146/77 | HR 84 | Temp 100.0°F | Resp 16 | Wt 131.2 lb

## 2014-02-10 LAB — POCT RAPID STREP A (OFFICE): Rapid Strep A Screen: POSITIVE — AB

## 2014-02-10 MED ORDER — AMOXICILLIN 500 MG PO CAPS: 500.0000 mg | ORAL_CAPSULE | Freq: Three times a day (TID) | ORAL | Status: DC

## 2014-02-10 MED ORDER — FLUCONAZOLE 150 MG PO TABS: 150.0000 mg | ORAL_TABLET | Freq: Once | ORAL | Status: DC

## 2014-02-10 NOTE — Patient Instructions (Addendum)
Strep Throat Strep throat is an infection of the throat caused by a bacteria named Streptococcus pyogenes. Your caregiver may call the infection streptococcal "tonsillitis" or "pharyngitis" depending on whether there are signs of inflammation in the tonsils or back of the throat. Strep throat is most common in children aged 46 15 years during the cold months of the year, but it can occur in people of any age during any season. This infection is spread from person to person (contagious) through coughing, sneezing, or other close contact. SYMPTOMS   Fever or chills.  Painful, swollen, red tonsils or throat.  Pain or difficulty when swallowing.  White or yellow spots on the tonsils or throat.  Swollen, tender lymph nodes or "glands" of the neck or under the jaw.  Red rash all over the body (rare). DIAGNOSIS  Many different infections can cause the same symptoms. A test must be done to confirm the diagnosis so the right treatment can be given. A "rapid strep test" can help your caregiver make the diagnosis in a few minutes. If this test is not available, a light swab of the infected area can be used for a throat culture test. If a throat culture test is done, results are usually available in a day or two. TREATMENT  Strep throat is treated with antibiotic medicine. HOME CARE INSTRUCTIONS   Gargle with 1 tsp of salt in 1 cup of warm water, 3 4 times per day or as needed for comfort.  Family members who also have a sore throat or fever should be tested for strep throat and treated with antibiotics if they have the strep infection.  Make sure everyone in your household washes their hands well.  Do not share food, drinking cups, or personal items that could cause the infection to spread to others.  You may need to eat a soft food diet until your sore throat gets better.  Drink enough water and fluids to keep your urine clear or pale yellow. This will help prevent dehydration.  Get plenty of  rest.  Stay home from school, daycare, or work until you have been on antibiotics for 24 hours.  Only take over-the-counter or prescription medicines for pain, discomfort, or fever as directed by your caregiver.  If antibiotics are prescribed, take them as directed. Finish them even if you start to feel better. SEEK MEDICAL CARE IF:   The glands in your neck continue to enlarge.  You develop a rash, cough, or earache.  You cough up green, yellow-brown, or bloody sputum.  You have pain or discomfort not controlled by medicines.  Your problems seem to be getting worse rather than better. SEEK IMMEDIATE MEDICAL CARE IF:   You develop any new symptoms such as vomiting, severe headache, stiff or painful neck, chest pain, shortness of breath, or trouble swallowing.  You develop severe throat pain, drooling, or changes in your voice.  You develop swelling of the neck, or the skin on the neck becomes red and tender.  You have a fever.  You develop signs of dehydration, such as fatigue, dry mouth, and decreased urination.  You become increasingly sleepy, or you cannot wake up completely. Document Released: 09/16/2000 Document Revised: 09/05/2012 Document Reviewed: 11/18/2010 [facility] Patient Information 2014 [place], [place]. Candida Infection, Adult A candida infection (also called yeast, fungus and Monilia infection) is an overgrowth of yeast that can occur anywhere on the body. A yeast infection commonly occurs in warm, moist body areas. Usually, the infection remains localized but  can spread to become a systemic infection. A yeast infection may be a sign of a more severe disease such as diabetes, leukemia, or AIDS. A yeast infection can occur in both men and women. In women, Candida vaginitis is a vaginal infection. It is one of the most common causes of vaginitis. Men usually do not have symptoms or know they have an infection until other problems develop. Men may find out they have a  yeast infection because their sex partner has a yeast infection. Uncircumcised men are more likely to get a yeast infection than circumcised men. This is because the uncircumcised glans is not exposed to air and does not remain as dry as that of a circumcised glans. Older adults may develop yeast infections around dentures. CAUSES  Women  Antibiotics.  Steroid medication taken for a long time.  Being overweight (obese).  Diabetes.  Poor immune condition.  Certain serious medical conditions.  Immune suppressive medications for organ transplant patients.  Chemotherapy.  Pregnancy.  Menstration.  Stress and fatigue.  Intravenous drug use.  Oral contraceptives.  Wearing tight-fitting clothes in the crotch area.  Catching it from a sex partner who has a yeast infection.  Spermicide.  Intravenous, urinary, or other catheters. Men  Catching it from a sex partner who has a yeast infection.  Having oral or anal sex with a person who has the infection.  Spermicide.  Diabetes.  Antibiotics.  Poor immune system.  Medications that suppress the immune system.  Intravenous drug use.  Intravenous, urinary, or other catheters. SYMPTOMS  Women  Thick, white vaginal discharge.  Vaginal itching.  Redness and swelling in and around the vagina.  Irritation of the lips of the vagina and perineum.  Blisters on the vaginal lips and perineum.  Painful sexual intercourse.  Low blood sugar (hypoglycemia).  Painful urination.  Bladder infections.  Intestinal problems such as constipation, indigestion, bad breath, bloating, increase in gas, diarrhea, or loose stools. Men  Men may develop intestinal problems such as constipation, indigestion, bad breath, bloating, increase in gas, diarrhea, or loose stools.  Dry, cracked skin on the penis with itching or discomfort.  Jock itch.  Dry, flaky skin.  Athlete's foot.  Hypoglycemia. DIAGNOSIS  Women  A history  and an exam are performed.  The discharge may be examined under a microscope.  A culture may be taken of the discharge. Men  A history and an exam are performed.  Any discharge from the penis or areas of cracked skin will be looked at under the microscope and cultured.  Stool samples may be cultured. TREATMENT  Women  Vaginal antifungal suppositories and creams.  Medicated creams to decrease irritation and itching on the outside of the vagina.  Warm compresses to the perineal area to decrease swelling and discomfort.  Oral antifungal medications.  Medicated vaginal suppositories or cream for repeated or recurrent infections.  Wash and dry the irritation areas before applying the cream.  Eating yogurt with lactobacillus may help with prevention and treatment.  Sometimes painting the vagina with gentian violet solution may help if creams and suppositories do not work. Men  Antifungal creams and oral antifungal medications.  Sometimes treatment must continue for 30 days after the symptoms go away to prevent recurrence. HOME CARE INSTRUCTIONS  Women  Use cotton underwear and avoid tight-fitting clothing.  Avoid colored, scented toilet paper and deodorant tampons or pads.  Do not douche.  Keep your diabetes under control.  Finish all the prescribed medications.  Keep  your skin clean and dry.  Consume milk or yogurt with lactobacillus active culture regularly. If you get frequent yeast infections and think that is what the infection is, there are over-the-counter medications that you can get. If the infection does not show healing in 3 days, talk to your caregiver.  Tell your sex partner you have a yeast infection. Your partner may need treatment also, especially if your infection does not clear up or recurs. Men  Keep your skin clean and dry.  Keep your diabetes under control.  Finish all prescribed medications.  Tell your sex partner that you have a yeast  infection so they can be treated if necessary. SEEK MEDICAL CARE IF:   Your symptoms do not clear up or worsen in one week after treatment.  You have an oral temperature above 102 F (38.9 C).  You have trouble swallowing or eating for a prolonged time.  You develop blisters on and around your vagina.  You develop vaginal bleeding and it is not your menstrual period.  You develop abdominal pain.  You develop intestinal problems as mentioned above.  You get weak or lightheaded.  You have painful or increased urination.  You have pain during sexual intercourse. MAKE SURE YOU:   Understand these instructions.  Will watch your condition.  Will get help right away if you are not doing well or get worse. Document Released: 10/27/2004 Document Revised: 12/12/2011 Document Reviewed: 02/08/2010 [facility] Patient Information 2014 [place].

## 2014-02-10 NOTE — Progress Notes (Unsigned)
Patient here today with complaints of sore throat, fever, headache, and pain through out body. Patient states she has tried tylenol every six hours for three days with no relief. Rapid Strep test was positive. Consulted with Dr. Doreene Burke who prescribed Amoxicillin 500 mg capsules three times a day for seven days. Also prescribed Difulcan 150 mg once for yeast and repeat in one week.

## 2014-03-12 ENCOUNTER — Encounter: Payer: Self-pay | Admitting: Internal Medicine

## 2014-03-12 ENCOUNTER — Ambulatory Visit: Payer: No Typology Code available for payment source | Attending: Internal Medicine | Admitting: Internal Medicine

## 2014-03-12 VITALS — BP 128/81 | HR 97 | Temp 98.3°F | Resp 17 | Wt 132.6 lb

## 2014-03-12 DIAGNOSIS — Z79899 Other long term (current) drug therapy: Secondary | ICD-10-CM | POA: Insufficient documentation

## 2014-03-12 DIAGNOSIS — K219 Gastro-esophageal reflux disease without esophagitis: Secondary | ICD-10-CM

## 2014-03-12 DIAGNOSIS — R109 Unspecified abdominal pain: Secondary | ICD-10-CM

## 2014-03-12 DIAGNOSIS — R140 Abdominal distension (gaseous): Secondary | ICD-10-CM

## 2014-03-12 DIAGNOSIS — R142 Eructation: Secondary | ICD-10-CM

## 2014-03-12 DIAGNOSIS — R141 Gas pain: Secondary | ICD-10-CM

## 2014-03-12 DIAGNOSIS — R143 Flatulence: Secondary | ICD-10-CM

## 2014-03-12 MED ORDER — SIMETHICONE 80 MG PO CHEW: 80.0000 mg | CHEWABLE_TABLET | Freq: Four times a day (QID) | ORAL | Status: DC | PRN

## 2014-03-12 MED ORDER — SENNOSIDES-DOCUSATE SODIUM 8.6-50 MG PO TABS: 1.0000 | ORAL_TABLET | Freq: Every evening | ORAL | Status: DC | PRN

## 2014-03-12 MED ORDER — OMEPRAZOLE 40 MG PO CPDR: 40.0000 mg | DELAYED_RELEASE_CAPSULE | Freq: Every day | ORAL | Status: DC

## 2014-03-12 NOTE — Progress Notes (Signed)
MRN: [id number] Name: Lauren Petersen  Sex: female Age: 48 y.o. DOB: Jan 19, 1966  Allergies: Review of patient's allergies indicates no known allergies.  Chief Complaint  Patient presents with  . Follow-up    HPI: Patient is 48 y.o. female who has to of GERD and is on Prilosec, she still reported to have lot of bloating sensation as well as abdominal pain especially after she eats any food except for rice the pain is intermittent, she had ultrasound done in 2013 which reported small gallbladder stone, currently she denies any fever chills nausea vomiting any change in bowel habits. Patient is requesting refill on her medications.  History reviewed. No pertinent past medical history.  Past Surgical History  Procedure Laterality Date  . Cesarean section        Medication List       This list is accurate as of: 03/12/14  3:37 PM.  Always use your most recent med list.               amoxicillin 500 MG capsule  Commonly known as:  AMOXIL  Take 1 capsule (500 mg total) by mouth 3 (three) times daily.     fluconazole 150 MG tablet  Commonly known as:  DIFLUCAN  Take 1 tablet (150 mg total) by mouth once.     naproxen 500 MG tablet  Commonly known as:  NAPROSYN  Take 1 po every 12 hours with food prn sore throat     omeprazole 40 MG capsule  Commonly known as:  PRILOSEC  Take 1 capsule (40 mg total) by mouth at bedtime.     PARAGARD INTRAUTERINE COPPER IU  1 Intra Uterine Device by Intrauterine route.     senna-docusate 8.6-50 MG per tablet  Commonly known as:  SENOKOT S  Take 1 tablet by mouth at bedtime as needed for mild constipation.     simethicone 80 MG chewable tablet  Commonly known as:  GAS-X  Chew 1 tablet (80 mg total) by mouth every 6 (six) hours as needed for flatulence (take one tab at bed time everynight).        Meds ordered this encounter  Medications  . senna-docusate (SENOKOT S) 8.6-50 MG per tablet    Sig: Take 1 tablet by mouth at bedtime  as needed for mild constipation.    Dispense:  30 tablet    Refill:  2  . simethicone (GAS-X) 80 MG chewable tablet    Sig: Chew 1 tablet (80 mg total) by mouth every 6 (six) hours as needed for flatulence (take one tab at bed time everynight).    Dispense:  60 tablet    Refill:  3  . omeprazole (PRILOSEC) 40 MG capsule    Sig: Take 1 capsule (40 mg total) by mouth at bedtime.    Dispense:  30 capsule    Refill:  3     There is no immunization history on file for this patient.  History reviewed. No pertinent family history.  History  Substance Use Topics  . Smoking status: Never Smoker   . Smokeless tobacco: Not on file  . Alcohol Use: No    Review of Systems   As noted in HPI  Filed Vitals:   03/12/14 1510  BP: 128/81  Pulse: 97  Temp: 98.3 F (36.8 C)  Resp: 17    Physical Exam  Physical Exam  Constitutional: No distress.  Eyes: EOM are normal. Pupils are equal, round, and reactive to light.  Cardiovascular: Normal rate and regular rhythm.   Pulmonary/Chest: Breath sounds normal. No respiratory distress. She has no wheezes. She has no rales.  Abdominal: Soft. There is no tenderness. There is no rebound.    CBC    Component Value Date/Time   WBC 5.9 09/11/2013 1731   RBC 4.69 09/11/2013 1731   HGB 11.1* 09/11/2013 1731   HCT 34.4* 09/11/2013 1731   PLT 253 09/11/2013 1731   MCV 73.3* 09/11/2013 1731   LYMPHSABS 2.9 09/11/2013 1731   MONOABS 0.3 09/11/2013 1731   EOSABS 0.2 09/11/2013 1731   BASOSABS 0.0 09/11/2013 1731    CMP     Component Value Date/Time   NA 137 09/11/2013 1731   K 3.9 09/11/2013 1731   CL 103 09/11/2013 1731   CO2 26 09/11/2013 1731   GLUCOSE 92 09/11/2013 1731   BUN 10 09/11/2013 1731   CREATININE 0.81 09/11/2013 1731   CREATININE 0.64 07/15/2008 2018   CALCIUM 9.5 09/11/2013 1731   PROT 7.7 09/11/2013 1731   ALBUMIN 4.2 09/11/2013 1731   AST 14 09/11/2013 1731   ALT 11 09/11/2013 1731   ALKPHOS 55 09/11/2013 1731     BILITOT 0.2* 09/11/2013 1731    Lab Results  Component Value Date/Time   CHOL 158 09/11/2013  5:31 PM    No components found with this basename: hga1c    Lab Results  Component Value Date/Time   AST 14 09/11/2013  5:31 PM    Assessment and Plan  GERD (gastroesophageal reflux disease) - Plan: Continue with omeprazole (PRILOSEC) 40 MG capsule  Abdominal pain, unspecified site/Abdominal bloating - Plan: I have ordered a repeat her US Abdomen Complete to check for gallstones   Flatulence - Plan: simethicone (GAS-X) 80 MG chewable tablet   Health Maintenance  -Mammogram: Up-to-date  Return in about 3 months (around 06/12/2014) for gerd.  Lorayne Marek, MD

## 2014-03-12 NOTE — Progress Notes (Signed)
Patient here with interpreter Complains of having stomach pains when ever she eats States this has been going on for five years Has never been seen by GI for her issues Medication is not helping

## 2014-03-21 ENCOUNTER — Ambulatory Visit (HOSPITAL_COMMUNITY): Payer: Self-pay

## 2014-03-21 ENCOUNTER — Ambulatory Visit: Payer: No Typology Code available for payment source | Attending: Internal Medicine

## 2014-03-27 ENCOUNTER — Ambulatory Visit (HOSPITAL_COMMUNITY): Payer: No Typology Code available for payment source

## 2014-04-01 ENCOUNTER — Telehealth: Payer: Self-pay

## 2014-04-01 ENCOUNTER — Ambulatory Visit (HOSPITAL_COMMUNITY)
Admission: RE | Admit: 2014-04-01 | Discharge: 2014-04-01 | Disposition: A | Discharge status: Home / Self Care | Payer: No Typology Code available for payment source | Source: Ambulatory Visit | Attending: Internal Medicine | Admitting: Internal Medicine

## 2014-04-01 DIAGNOSIS — R109 Unspecified abdominal pain: Secondary | ICD-10-CM

## 2014-04-01 DIAGNOSIS — K8020 Calculus of gallbladder without cholecystitis without obstruction: Secondary | ICD-10-CM | POA: Insufficient documentation

## 2014-04-01 DIAGNOSIS — R140 Abdominal distension (gaseous): Secondary | ICD-10-CM

## 2014-04-01 NOTE — Telephone Encounter (Signed)
Interpreter line used Patient was not available  message left on voice mail to return our call

## 2014-04-01 NOTE — Telephone Encounter (Signed)
Message copied by Dorothe Pea on Tue Apr 01, 2014  2:25 PM ------      Message from: Lorayne Marek      Created: Tue Apr 01, 2014 10:28 AM       Call and let  the patient know that her ultrasound reported gallstones, since the patient has symptoms of abdominal pain, put in the referral to general surgery for further evaluation and possible elective cholecystectomy. ------

## 2014-04-14 DIAGNOSIS — K8070 Calculus of gallbladder and bile duct without cholecystitis without obstruction: Secondary | ICD-10-CM | POA: Insufficient documentation

## 2014-06-12 ENCOUNTER — Ambulatory Visit: Payer: No Typology Code available for payment source | Attending: Internal Medicine | Admitting: Internal Medicine

## 2014-06-12 ENCOUNTER — Encounter: Payer: Self-pay | Admitting: Internal Medicine

## 2014-06-12 ENCOUNTER — Other Ambulatory Visit (HOSPITAL_COMMUNITY)
Admission: RE | Admit: 2014-06-12 | Discharge: 2014-06-12 | Disposition: A | Discharge status: Home / Self Care | Payer: No Typology Code available for payment source | Source: Ambulatory Visit | Attending: Internal Medicine | Admitting: Internal Medicine

## 2014-06-12 VITALS — BP 123/77 | HR 55 | Temp 98.2°F | Resp 16

## 2014-06-12 DIAGNOSIS — K219 Gastro-esophageal reflux disease without esophagitis: Secondary | ICD-10-CM | POA: Insufficient documentation

## 2014-06-12 DIAGNOSIS — R109 Unspecified abdominal pain: Secondary | ICD-10-CM

## 2014-06-12 DIAGNOSIS — Z975 Presence of (intrauterine) contraceptive device: Secondary | ICD-10-CM | POA: Insufficient documentation

## 2014-06-12 DIAGNOSIS — R1032 Left lower quadrant pain: Secondary | ICD-10-CM | POA: Insufficient documentation

## 2014-06-12 DIAGNOSIS — Z79899 Other long term (current) drug therapy: Secondary | ICD-10-CM | POA: Insufficient documentation

## 2014-06-12 DIAGNOSIS — N760 Acute vaginitis: Secondary | ICD-10-CM | POA: Insufficient documentation

## 2014-06-12 DIAGNOSIS — Z113 Encounter for screening for infections with a predominantly sexual mode of transmission: Secondary | ICD-10-CM | POA: Insufficient documentation

## 2014-06-12 DIAGNOSIS — R300 Dysuria: Secondary | ICD-10-CM

## 2014-06-12 DIAGNOSIS — M549 Dorsalgia, unspecified: Secondary | ICD-10-CM | POA: Insufficient documentation

## 2014-06-12 LAB — POCT URINALYSIS DIPSTICK
Bilirubin, UA: NEGATIVE
Blood, UA: NEGATIVE
Glucose, UA: NEGATIVE
KETONES UA: NEGATIVE
LEUKOCYTES UA: NEGATIVE
Nitrite, UA: NEGATIVE
PROTEIN UA: NEGATIVE
Spec Grav, UA: <=1.005
UROBILINOGEN UA: 0.2
pH, UA: 5.5

## 2014-06-12 NOTE — Progress Notes (Signed)
Pt is here today still having a burning sensation when urinating. Pt is having flank pain due to kidney stones. Pt was unable to treat the stones due to not having insurance.

## 2014-06-12 NOTE — Progress Notes (Signed)
Patient ID: Lauren Petersen, female   DOB: 04-07-1966, 48 y.o.   MRN: [id number]  CC: burning with urination  HPI:  Patient states that she had a yeast infection ten days ago and used a cream which improved the itch.  Now she reports that she is having burning with urination and with intercourse for the past ten days as well. She denies any skin irritation in her vaginal region. She reports the odor improved.  She states that she has left flank pain and LLQ pain.  The pain is described as a spasm pain.    No Known Allergies History reviewed. No pertinent past medical history. Current Outpatient Prescriptions on File Prior to Visit  Medication Sig Dispense Refill  . amoxicillin (AMOXIL) 500 MG capsule Take 1 capsule (500 mg total) by mouth 3 (three) times daily.  21 capsule  0  . fluconazole (DIFLUCAN) 150 MG tablet Take 1 tablet (150 mg total) by mouth once.  1 tablet  1  . naproxen (NAPROSYN) 500 MG tablet Take 1 po every 12 hours with food prn sore throat  14 tablet  0  . omeprazole (PRILOSEC) 40 MG capsule Take 1 capsule (40 mg total) by mouth at bedtime.  30 capsule  3  . PARAGARD INTRAUTERINE COPPER IU 1 Intra Uterine Device by Intrauterine route.      . senna-docusate (SENOKOT S) 8.6-50 MG per tablet Take 1 tablet by mouth at bedtime as needed for mild constipation.  30 tablet  2  . simethicone (GAS-X) 80 MG chewable tablet Chew 1 tablet (80 mg total) by mouth every 6 (six) hours as needed for flatulence (take one tab at bed time everynight).  60 tablet  3  . [DISCONTINUED] ranitidine (ZANTAC) 150 MG tablet Take 150 mg by mouth 2 (two) times daily.       No current facility-administered medications on file prior to visit.   History reviewed. No pertinent family history. History   Social History  . Marital Status: Married    Spouse Name: N/A    Number of Children: N/A  . Years of Education: N/A   Occupational History  . Not on file.   Social History Main Topics  . Smoking status:  Never Smoker   . Smokeless tobacco: Not on file  . Alcohol Use: No  . Drug Use: No  . Sexual Activity: Yes    Birth Control/ Protection: IUD   Other Topics Concern  . Not on file   Social History Narrative  . No narrative on file    Review of Systems  Gastrointestinal: Positive for abdominal pain.  Genitourinary: Positive for dysuria and flank pain. Negative for urgency, frequency and hematuria.   .    Objective:   Filed Vitals:   06/12/14 1553  BP: 123/77  Pulse: 55  Temp: 98.2 F (36.8 C)  Resp: 16    Physical Exam  Cardiovascular: Normal rate, regular rhythm and normal heart sounds.   Pulmonary/Chest: Effort normal and breath sounds normal.  Abdominal: Soft. Bowel sounds are normal. She exhibits no distension. There is no tenderness.  Genitourinary: Vagina normal and uterus normal. No vaginal discharge (no odor noted) found.  Skin: Skin is warm and dry. No erythema.     Lab Results  Component Value Date   WBC 5.9 09/11/2013   HGB 11.1* 09/11/2013   HCT 34.4* 09/11/2013   MCV 73.3* 09/11/2013   PLT 253 09/11/2013   Lab Results  Component Value Date   CREATININE  0.81 09/11/2013   BUN 10 09/11/2013   NA 137 09/11/2013   K 3.9 09/11/2013   CL 103 09/11/2013   CO2 26 09/11/2013    No results found for this basename: HGBA1C   Lipid Panel     Component Value Date/Time   CHOL 158 09/11/2013 1731   TRIG 89 09/11/2013 1731   HDL 53 09/11/2013 1731   CHOLHDL 3.0 09/11/2013 1731   VLDL 18 09/11/2013 1731   LDLCALC 87 09/11/2013 1731       Assessment and plan:   Lauren Petersen was seen today for follow-up.  Diagnoses and associated orders for this visit:  Dysuria - Urinalysis Dipstick- negative - Cervicovaginal ancillary only - Ambulatory referral to Urology  Flank pain - Ambulatory referral to Urology - US Renal; Future--negative   Return if symptoms worsen or fail to improve, for with her original PCP-advani.       Lauren Manning,  NP-C [facility] [phone] 06/29/2014, 9:26 PM

## 2014-06-17 ENCOUNTER — Ambulatory Visit (HOSPITAL_COMMUNITY)
Admission: RE | Admit: 2014-06-17 | Discharge: 2014-06-17 | Disposition: A | Discharge status: Home / Self Care | Payer: No Typology Code available for payment source | Source: Ambulatory Visit | Attending: Internal Medicine | Admitting: Internal Medicine

## 2014-06-17 DIAGNOSIS — R109 Unspecified abdominal pain: Secondary | ICD-10-CM | POA: Insufficient documentation

## 2014-06-17 DIAGNOSIS — R300 Dysuria: Secondary | ICD-10-CM | POA: Insufficient documentation

## 2014-06-18 ENCOUNTER — Telehealth: Payer: Self-pay | Admitting: *Deleted

## 2014-06-18 NOTE — Telephone Encounter (Signed)
Message copied by Joan Mayans on Wed Jun 18, 2014  2:29 PM ------      Message from: [name] A      Created: Fri Jun 13, 2014  4:09 PM       Pelvic is negative for infections ------

## 2014-06-18 NOTE — Telephone Encounter (Signed)
Pt is aware of her pelvic exam.

## 2014-07-25 ENCOUNTER — Ambulatory Visit: Payer: No Typology Code available for payment source | Attending: Internal Medicine

## 2014-08-04 ENCOUNTER — Encounter: Payer: Self-pay | Admitting: Internal Medicine

## 2014-12-12 ENCOUNTER — Ambulatory Visit: Payer: Self-pay | Attending: Internal Medicine

## 2014-12-19 ENCOUNTER — Other Ambulatory Visit: Payer: Self-pay | Admitting: Internal Medicine

## 2014-12-19 DIAGNOSIS — Z1231 Encounter for screening mammogram for malignant neoplasm of breast: Secondary | ICD-10-CM

## 2014-12-24 ENCOUNTER — Ambulatory Visit (HOSPITAL_COMMUNITY): Payer: Self-pay

## 2014-12-25 ENCOUNTER — Ambulatory Visit (HOSPITAL_COMMUNITY)
Admission: RE | Admit: 2014-12-25 | Discharge: 2014-12-25 | Disposition: A | Discharge status: Home / Self Care | Payer: No Typology Code available for payment source | Source: Ambulatory Visit | Attending: Internal Medicine | Admitting: Internal Medicine

## 2014-12-25 DIAGNOSIS — Z1231 Encounter for screening mammogram for malignant neoplasm of breast: Secondary | ICD-10-CM

## 2014-12-26 ENCOUNTER — Ambulatory Visit: Payer: No Typology Code available for payment source | Attending: Internal Medicine | Admitting: Internal Medicine

## 2014-12-26 ENCOUNTER — Encounter: Payer: Self-pay | Admitting: Internal Medicine

## 2014-12-26 VITALS — BP 138/73 | HR 70 | Temp 98.0°F | Resp 16 | Wt 134.8 lb

## 2014-12-26 DIAGNOSIS — Z139 Encounter for screening, unspecified: Secondary | ICD-10-CM

## 2014-12-26 DIAGNOSIS — H538 Other visual disturbances: Secondary | ICD-10-CM

## 2014-12-26 DIAGNOSIS — M25552 Pain in left hip: Secondary | ICD-10-CM | POA: Insufficient documentation

## 2014-12-26 LAB — CBC WITH DIFFERENTIAL/PLATELET
BASOS PCT: 1 % (ref 0–1)
Basophils Absolute: 0.1 10*3/uL (ref 0.0–0.1)
EOS PCT: 3 % (ref 0–5)
Eosinophils Absolute: 0.2 10*3/uL (ref 0.0–0.7)
HCT: 34.5 % — ABNORMAL LOW (ref 36.0–46.0)
Hemoglobin: 10.7 g/dL — ABNORMAL LOW (ref 12.0–15.0)
Lymphocytes Relative: 42 % (ref 12–46)
Lymphs Abs: 2.4 10*3/uL (ref 0.7–4.0)
MCH: 21.9 pg — ABNORMAL LOW (ref 26.0–34.0)
MCHC: 31 g/dL (ref 30.0–36.0)
MCV: 70.7 fL — AB (ref 78.0–100.0)
MPV: 10.2 fL (ref 8.6–12.4)
Monocytes Absolute: 0.4 10*3/uL (ref 0.1–1.0)
Monocytes Relative: 7 % (ref 3–12)
Neutro Abs: 2.6 10*3/uL (ref 1.7–7.7)
Neutrophils Relative %: 47 % (ref 43–77)
Platelets: 306 10*3/uL (ref 150–400)
RBC: 4.88 MIL/uL (ref 3.87–5.11)
RDW: 17.7 % — AB (ref 11.5–15.5)
WBC: 5.6 10*3/uL (ref 4.0–10.5)

## 2014-12-26 LAB — COMPLETE METABOLIC PANEL WITH GFR
ALT: 9 U/L (ref 0–35)
AST: 15 U/L (ref 0–37)
Albumin: 4.2 g/dL (ref 3.5–5.2)
Alkaline Phosphatase: 52 U/L (ref 39–117)
BUN: 9 mg/dL (ref 6–23)
CHLORIDE: 104 meq/L (ref 96–112)
CO2: 27 mEq/L (ref 19–32)
Calcium: 9.9 mg/dL (ref 8.4–10.5)
Creat: 0.72 mg/dL (ref 0.50–1.10)
GFR, Est African American: >89 mL/min
GFR, Est Non African American: >89 mL/min
GLUCOSE: 100 mg/dL — AB (ref 70–99)
Potassium: 4.4 mEq/L (ref 3.5–5.3)
Sodium: 139 mEq/L (ref 135–145)
Total Bilirubin: 0.3 mg/dL (ref 0.2–1.2)
Total Protein: 7.3 g/dL (ref 6.0–8.3)

## 2014-12-26 LAB — HEMOGLOBIN A1C
Hgb A1c MFr Bld: 5.7 % — ABNORMAL HIGH (ref <5.7)
Mean Plasma Glucose: 117 mg/dL — ABNORMAL HIGH (ref <117)

## 2014-12-26 LAB — TSH: TSH: 1.958 u[IU]/mL (ref 0.350–4.500)

## 2014-12-26 MED ORDER — NAPROXEN 500 MG PO TABS: ORAL_TABLET | ORAL | Status: DC

## 2014-12-26 NOTE — Progress Notes (Signed)
MRN: [id number] Name: Lauren Petersen  Sex: female Age: 49 y.o. DOB: 04/29/1966  Allergies: Review of patient's allergies indicates no known allergies.  Chief Complaint  Patient presents with  . Follow-up    HPI: Patient is 49 y.o. female who comes today complaining of left-sided hip pain on and off for the last few weeks denies any recent fall or trauma, sometimes she feels the pain radiates down to the leg, denies any urinary or stool incontinence denies any nausea vomiting fever chills.as per patient the pain is more worse when she laid down on the left side, she has not tried any medication to help her with the symptoms.patient is also complaining of on and off blurry vision and is requesting referral to see an ophthalmologist.  History reviewed. No pertinent past medical history.  Past Surgical History  Procedure Laterality Date  . Cesarean section        Medication List       This list is accurate as of: 12/26/14 11:43 AM.  Always use your most recent med list.               amoxicillin 500 MG capsule  Commonly known as:  AMOXIL  Take 1 capsule (500 mg total) by mouth 3 (three) times daily.     fluconazole 150 MG tablet  Commonly known as:  DIFLUCAN  Take 1 tablet (150 mg total) by mouth once.     naproxen 500 MG tablet  Commonly known as:  NAPROSYN  Take 1 po every 12 hours with food prn sore throat     omeprazole 40 MG capsule  Commonly known as:  PRILOSEC  Take 1 capsule (40 mg total) by mouth at bedtime.     PARAGARD INTRAUTERINE COPPER IU  1 Intra Uterine Device by Intrauterine route.     senna-docusate 8.6-50 MG per tablet  Commonly known as:  SENOKOT S  Take 1 tablet by mouth at bedtime as needed for mild constipation.     simethicone 80 MG chewable tablet  Commonly known as:  GAS-X  Chew 1 tablet (80 mg total) by mouth every 6 (six) hours as needed for flatulence (take one tab at bed time everynight).        Meds ordered this encounter    Medications  . naproxen (NAPROSYN) 500 MG tablet    Sig: Take 1 po every 12 hours with food prn sore throat    Dispense:  60 tablet    Refill:  0     There is no immunization history on file for this patient.  History reviewed. No pertinent family history.  History  Substance Use Topics  . Smoking status: Never Smoker   . Smokeless tobacco: Not on file  . Alcohol Use: No    Review of Systems   As noted in HPI  Filed Vitals:   12/26/14 1108  BP: 138/73  Pulse: 70  Temp: 98 F (36.7 C)  Resp: 16    Physical Exam  Physical Exam  Constitutional: No distress.  Eyes: EOM are normal. Pupils are equal, round, and reactive to light.  Cardiovascular: Normal rate and regular rhythm.   Pulmonary/Chest: Breath sounds normal. No respiratory distress. She has no wheezes. She has no rales.  Abdominal: Soft. There is no tenderness. There is no rebound and no guarding.  Musculoskeletal:  No significant spinal or paraspinal tenderness, SLR test negative, DTR 2+    CBC    Component Value Date/Time  WBC 5.9 09/11/2013 1731   RBC 4.69 09/11/2013 1731   HGB 11.1* 09/11/2013 1731   HCT 34.4* 09/11/2013 1731   PLT 253 09/11/2013 1731   MCV 73.3* 09/11/2013 1731   LYMPHSABS 2.9 09/11/2013 1731   MONOABS 0.3 09/11/2013 1731   EOSABS 0.2 09/11/2013 1731   BASOSABS 0.0 09/11/2013 1731    CMP     Component Value Date/Time   NA 137 09/11/2013 1731   K 3.9 09/11/2013 1731   CL 103 09/11/2013 1731   CO2 26 09/11/2013 1731   GLUCOSE 92 09/11/2013 1731   BUN 10 09/11/2013 1731   CREATININE 0.81 09/11/2013 1731   CREATININE 0.64 07/15/2008 2018   CALCIUM 9.5 09/11/2013 1731   PROT 7.7 09/11/2013 1731   ALBUMIN 4.2 09/11/2013 1731   AST 14 09/11/2013 1731   ALT 11 09/11/2013 1731   ALKPHOS 55 09/11/2013 1731   BILITOT 0.2* 09/11/2013 1731    Lab Results  Component Value Date/Time   CHOL 158 09/11/2013 05:31 PM    No components found for: HGA1C  Lab Results   Component Value Date/Time   AST 14 09/11/2013 05:31 PM    Assessment and Plan  Left hip pain - Plan: DG HIP UNILAT WITH PELVIS 2-3 VIEWS LEFT, naproxen (NAPROSYN) 500 MG tablet  Blurry vision - Plan: Ambulatory referral to Ophthalmology  Screening - Plan: ordered baseline blood work CBC with Differential/Platelet, COMPLETE METABOLIC PANEL WITH GFR, TSH, Vit D  25 hydroxy (rtn osteoporosis monitoring), Hemoglobin A1c   Health Maintenance -Mammogram:up-to-date -Vaccinations:  As per patient she already had flu shot.  Return in about 3 months (around 03/28/2015), or if symptoms worsen or fail to improve.   This note has been created with Surveyor, quantity. Any transcriptional errors are unintentional.    Lorayne Marek, MD

## 2014-12-26 NOTE — Progress Notes (Signed)
Patient here with interpreter Complains of still having pain to her left side and pain  All the way down her left leg Patient states she had x ray done and everything is normal but still Having pain

## 2014-12-27 LAB — VITAMIN D 25 HYDROXY (VIT D DEFICIENCY, FRACTURES): Vit D, 25-Hydroxy: 23 ng/mL — ABNORMAL LOW (ref 30–100)

## 2014-12-29 ENCOUNTER — Ambulatory Visit (HOSPITAL_COMMUNITY)
Admission: RE | Admit: 2014-12-29 | Discharge: 2014-12-29 | Disposition: A | Discharge status: Home / Self Care | Payer: No Typology Code available for payment source | Source: Ambulatory Visit | Attending: Internal Medicine | Admitting: Internal Medicine

## 2014-12-29 ENCOUNTER — Telehealth: Payer: Self-pay

## 2014-12-29 DIAGNOSIS — M25552 Pain in left hip: Secondary | ICD-10-CM | POA: Insufficient documentation

## 2014-12-29 MED ORDER — VITAMIN D (ERGOCALCIFEROL) 1.25 MG (50000 UNIT) PO CAPS: 50000.0000 [IU] | ORAL_CAPSULE | ORAL | Status: DC

## 2014-12-29 NOTE — Telephone Encounter (Signed)
Interpreter line used [id number] Patient is aware of her lab results and understood Patient will pick up her prescription at [place]

## 2014-12-29 NOTE — Telephone Encounter (Signed)
-----   Message from Lorayne Marek, MD sent at 12/29/2014  9:25 AM EDT ----- Blood work reviewed, noticed low vitamin D, call patient advise to start ergocalciferol 50,000 units once a week for the duration of  12 weeks. Also patient has anemia, advise patient to take over-the-counter iron supplement 3 times a day. Blood work reviewed noticed impaired fasting glucose, hemoglobin A1c of 5.7%,  call and advise patient for low carbohydrate diet.

## 2015-01-20 ENCOUNTER — Telehealth: Payer: Self-pay | Admitting: *Deleted

## 2015-01-20 NOTE — Telephone Encounter (Signed)
-----   Message from Dorothe Pea, LPN sent at 4/85/4627  1:58 PM EDT -----   ----- Message -----    From: Lorayne Marek, MD    Sent: 12/30/2014  11:22 AM      To: Dorothe Pea, LPN  Call and let the patient know that x-ray reported  IMPRESSION: There is no acute bony abnormality of the pelvis. The left hip joint is unremarkable. There is minimal narrowing of the right hip joint space.

## 2015-01-20 NOTE — Telephone Encounter (Signed)
Interpreter left message for pt to return my call.

## 2015-07-14 ENCOUNTER — Ambulatory Visit: Payer: No Typology Code available for payment source | Attending: Internal Medicine

## 2015-07-21 ENCOUNTER — Encounter: Payer: Self-pay | Admitting: Internal Medicine

## 2015-07-21 ENCOUNTER — Ambulatory Visit: Payer: No Typology Code available for payment source | Attending: Internal Medicine | Admitting: Internal Medicine

## 2015-07-21 VITALS — BP 143/76 | HR 79 | Temp 98.0°F | Resp 16 | Ht 60.0 in | Wt 138.0 lb

## 2015-07-21 DIAGNOSIS — K219 Gastro-esophageal reflux disease without esophagitis: Secondary | ICD-10-CM | POA: Insufficient documentation

## 2015-07-21 DIAGNOSIS — M25552 Pain in left hip: Secondary | ICD-10-CM

## 2015-07-21 DIAGNOSIS — Z79899 Other long term (current) drug therapy: Secondary | ICD-10-CM | POA: Insufficient documentation

## 2015-07-21 DIAGNOSIS — D509 Iron deficiency anemia, unspecified: Secondary | ICD-10-CM

## 2015-07-21 DIAGNOSIS — Z23 Encounter for immunization: Secondary | ICD-10-CM

## 2015-07-21 LAB — CBC WITH DIFFERENTIAL/PLATELET
BASOS ABS: 0 10*3/uL (ref 0.0–0.1)
BASOS PCT: 0 % (ref 0–1)
EOS PCT: 4 % (ref 0–5)
Eosinophils Absolute: 0.3 10*3/uL (ref 0.0–0.7)
HCT: 41.4 % (ref 36.0–46.0)
Hemoglobin: 13.6 g/dL (ref 12.0–15.0)
Lymphocytes Relative: 44 % (ref 12–46)
Lymphs Abs: 2.9 10*3/uL (ref 0.7–4.0)
MCH: 28.4 pg (ref 26.0–34.0)
MCHC: 32.9 g/dL (ref 30.0–36.0)
MCV: 86.4 fL (ref 78.0–100.0)
MONO ABS: 0.4 10*3/uL (ref 0.1–1.0)
MPV: 11.1 fL (ref 8.6–12.4)
Monocytes Relative: 6 % (ref 3–12)
NEUTROS ABS: 3 10*3/uL (ref 1.7–7.7)
Neutrophils Relative %: 46 % (ref 43–77)
PLATELETS: 237 10*3/uL (ref 150–400)
RBC: 4.79 MIL/uL (ref 3.87–5.11)
RDW: 13.1 % (ref 11.5–15.5)
WBC: 6.5 10*3/uL (ref 4.0–10.5)

## 2015-07-21 MED ORDER — NAPROXEN 500 MG PO TABS: ORAL_TABLET | ORAL | Status: DC

## 2015-07-21 NOTE — Progress Notes (Signed)
Patient ID: Lauren Petersen, female   DOB: 1966-03-16, 49 y.o.   MRN: [id number]  CC: left hip pain  HPI: Lauren Petersen is a 49 y.o. female here today for a follow up visit.  Patient has past medical history of GERD. Patient reports that she has been having left hip pain that radiates down to her toes for the past several months. She states that she was seen here in the past by a different provider and had xrays that were negative. She reports that the pain is rarely in her back but more on her side. Pain is usually every 3 days and is aggravated by walking. She states that the pain feels like a deep ache or bone pain. She has tried tylenol and ibuprofen for pain. Patient would like her hemoglobin rechecked, states that she stopped taking her iron tablets.   Patient has No headache, No chest pain, No abdominal pain - No Nausea, No new weakness tingling or numbness, No Cough - SOB.  No Known Allergies History reviewed. No pertinent past medical history. Current Outpatient Prescriptions on File Prior to Visit  Medication Sig Dispense Refill  . amoxicillin (AMOXIL) 500 MG capsule Take 1 capsule (500 mg total) by mouth 3 (three) times daily. 21 capsule 0  . fluconazole (DIFLUCAN) 150 MG tablet Take 1 tablet (150 mg total) by mouth once. 1 tablet 1  . naproxen (NAPROSYN) 500 MG tablet Take 1 po every 12 hours with food prn hip pain 60 tablet 0  . omeprazole (PRILOSEC) 40 MG capsule Take 1 capsule (40 mg total) by mouth at bedtime. 30 capsule 3  . PARAGARD INTRAUTERINE COPPER IU 1 Intra Uterine Device by Intrauterine route.    . senna-docusate (SENOKOT S) 8.6-50 MG per tablet Take 1 tablet by mouth at bedtime as needed for mild constipation. 30 tablet 2  . simethicone (GAS-X) 80 MG chewable tablet Chew 1 tablet (80 mg total) by mouth every 6 (six) hours as needed for flatulence (take one tab at bed time everynight). 60 tablet 3  . Vitamin D, Ergocalciferol, (DRISDOL) 50000 UNITS CAPS capsule Take 1  capsule (50,000 Units total) by mouth every 7 (seven) days. 12 capsule 0  . [DISCONTINUED] ranitidine (ZANTAC) 150 MG tablet Take 150 mg by mouth 2 (two) times daily.     No current facility-administered medications on file prior to visit.   History reviewed. No pertinent family history. Social History   Social History  . Marital Status: Married    Spouse Name: N/A  . Number of Children: N/A  . Years of Education: N/A   Occupational History  . Not on file.   Social History Main Topics  . Smoking status: Never Smoker   . Smokeless tobacco: Not on file  . Alcohol Use: No  . Drug Use: No  . Sexual Activity: Yes    Birth Control/ Protection: IUD   Other Topics Concern  . Not on file   Social History Narrative    Review of Systems: Other than what is stated in HPI, all other systems are negative.   Objective:   Filed Vitals:   07/21/15 1535  BP: 143/76  Pulse: 79  Temp: 98 F (36.7 C)  Resp: 16    Physical Exam  Cardiovascular: Normal rate, regular rhythm and normal heart sounds.   Pulmonary/Chest: Effort normal and breath sounds normal.  Abdominal: Bowel sounds are normal. There is no tenderness.  No CVA tenderness  Musculoskeletal: Normal range of motion. She exhibits  no edema or tenderness.  Skin: Skin is warm and dry.     Lab Results  Component Value Date   WBC 5.6 12/26/2014   HGB 10.7* 12/26/2014   HCT 34.5* 12/26/2014   MCV 70.7* 12/26/2014   PLT 306 12/26/2014   Lab Results  Component Value Date   CREATININE 0.72 12/26/2014   BUN 9 12/26/2014   NA 139 12/26/2014   K 4.4 12/26/2014   CL 104 12/26/2014   CO2 27 12/26/2014    Lab Results  Component Value Date   HGBA1C 5.7* 12/26/2014   Lipid Panel     Component Value Date/Time   CHOL 158 09/11/2013 1731   TRIG 89 09/11/2013 1731   HDL 53 09/11/2013 1731   CHOLHDL 3.0 09/11/2013 1731   VLDL 18 09/11/2013 1731   LDLCALC 87 09/11/2013 1731       Assessment and plan:   Orrie was  seen today for leg pain.  Diagnoses and all orders for this visit:  Left hip pain -    Refill naproxen (NAPROSYN) 500 MG tablet; Take 1 po every 12 hours with food prn hip pain  Iron deficiency anemia -     CBC with Differential  Need for influenza vaccination -     Flu Vaccine QUAD 36+ mos PF IM (Fluarix & Fluzone Quad PF)  Return if symptoms worsen or fail to improve.       Lance Bosch, [facility] [phone] 07/21/2015, 3:46 PM

## 2015-07-21 NOTE — Progress Notes (Signed)
Interpreter line used Lauren Petersen  [phone] Patient complains of having pain from her left hip that radiates down  Her right leg Patient also stated she needs to have her gall bladder removed and needs a referral  To general surgery

## 2015-07-22 ENCOUNTER — Telehealth: Payer: Self-pay

## 2015-07-22 NOTE — Telephone Encounter (Signed)
Interpreter line used [place] ID [id number] Patient not available Message left to return our call

## 2015-07-22 NOTE — Telephone Encounter (Signed)
-----   Message from Lance Bosch, NP sent at 07/22/2015  9:42 AM EDT ----- Labs are within normal limits

## 2015-11-27 ENCOUNTER — Other Ambulatory Visit: Payer: Self-pay

## 2015-11-27 DIAGNOSIS — Z1231 Encounter for screening mammogram for malignant neoplasm of breast: Secondary | ICD-10-CM

## 2015-12-29 ENCOUNTER — Ambulatory Visit
Admission: RE | Admit: 2015-12-29 | Discharge: 2015-12-29 | Disposition: A | Discharge status: Home / Self Care | Payer: No Typology Code available for payment source | Source: Ambulatory Visit

## 2015-12-29 DIAGNOSIS — Z1231 Encounter for screening mammogram for malignant neoplasm of breast: Secondary | ICD-10-CM

## 2015-12-30 ENCOUNTER — Telehealth: Payer: Self-pay

## 2015-12-30 NOTE — Telephone Encounter (Signed)
-----   Message from Lance Bosch, NP sent at 12/29/2015  4:10 PM EDT ----- No evidence of malignancy on mammogram

## 2015-12-30 NOTE — Telephone Encounter (Signed)
Interpreter line used [place] ID# [id number] Number listed in chart is not a valid number

## 2016-02-01 ENCOUNTER — Ambulatory Visit: Payer: Self-pay | Attending: Internal Medicine

## 2016-02-17 ENCOUNTER — Other Ambulatory Visit: Payer: Self-pay | Admitting: Orthopaedic Surgery

## 2016-02-17 ENCOUNTER — Other Ambulatory Visit (HOSPITAL_COMMUNITY): Payer: Self-pay | Admitting: Orthopaedic Surgery

## 2016-02-17 DIAGNOSIS — M48061 Spinal stenosis, lumbar region without neurogenic claudication: Secondary | ICD-10-CM

## 2016-02-18 ENCOUNTER — Ambulatory Visit: Payer: Self-pay | Admitting: Family Medicine

## 2016-02-23 ENCOUNTER — Ambulatory Visit (HOSPITAL_COMMUNITY): Payer: Self-pay

## 2016-07-14 ENCOUNTER — Ambulatory Visit: Payer: Self-pay | Attending: Internal Medicine

## 2016-09-19 ENCOUNTER — Ambulatory Visit (HOSPITAL_COMMUNITY)
Admission: RE | Admit: 2016-09-19 | Discharge: 2016-09-19 | Disposition: A | Discharge status: Home / Self Care | Payer: Self-pay | Source: Ambulatory Visit | Attending: Family Medicine | Admitting: Family Medicine

## 2016-09-19 ENCOUNTER — Ambulatory Visit: Payer: Self-pay | Attending: Family Medicine | Admitting: Family Medicine

## 2016-09-19 ENCOUNTER — Encounter: Payer: Self-pay | Admitting: Family Medicine

## 2016-09-19 VITALS — BP 144/79 | HR 73 | Temp 98.3°F | Resp 16 | Wt 133.0 lb

## 2016-09-19 DIAGNOSIS — G8929 Other chronic pain: Secondary | ICD-10-CM

## 2016-09-19 DIAGNOSIS — M545 Low back pain, unspecified: Secondary | ICD-10-CM

## 2016-09-19 DIAGNOSIS — Z23 Encounter for immunization: Secondary | ICD-10-CM

## 2016-09-19 DIAGNOSIS — M79605 Pain in left leg: Secondary | ICD-10-CM | POA: Insufficient documentation

## 2016-09-19 DIAGNOSIS — Z789 Other specified health status: Secondary | ICD-10-CM

## 2016-09-19 DIAGNOSIS — Z0000 Encounter for general adult medical examination without abnormal findings: Secondary | ICD-10-CM

## 2016-09-19 LAB — POCT URINE PREGNANCY: Preg Test, Ur: NEGATIVE

## 2016-09-19 MED ORDER — NAPROXEN 500 MG PO TABS: 500.0000 mg | ORAL_TABLET | Freq: Two times a day (BID) | ORAL | 1 refills | Status: DC

## 2016-09-19 MED FILL — NAPROXEN 500 MG TABLET: 500 | 15 days supply | Qty: 30 | Fill #0

## 2016-09-19 NOTE — Progress Notes (Signed)
Follow up Left leg/heel pain. Previous treatment did not help. S/p IUD, using birthcontrol: VYFEMLA

## 2016-09-19 NOTE — Progress Notes (Signed)
Subjective:  Patient ID: Lauren Petersen, female    DOB: Sep 08, 1966  Age: 50 y.o. MRN: [id number]  CC: Leg Pain and Back Pain   HPI Lauren Petersen presents for complaint of chronic left lower back and leg pain. She describes the pain as a 7/10. She denies any spasm or numbness. She does report occasional tingling. She denies any change in bowel or bladder habits. She denies taking anything to manage her pain. She also had recent visit to the health department to have her IUD removed. She is currently on oral contraceptives. She denies any smoking, CP, SOB, or swelling of the extremities.    Outpatient Medications Prior to Visit  Medication Sig Dispense Refill  . amoxicillin (AMOXIL) 500 MG capsule Take 1 capsule (500 mg total) by mouth 3 (three) times daily. (Patient not taking: Reported on 09/19/2016) 21 capsule 0  . fluconazole (DIFLUCAN) 150 MG tablet Take 1 tablet (150 mg total) by mouth once. (Patient not taking: Reported on 09/19/2016) 1 tablet 1  . omeprazole (PRILOSEC) 40 MG capsule Take 1 capsule (40 mg total) by mouth at bedtime. (Patient not taking: Reported on 09/19/2016) 30 capsule 3  . senna-docusate (SENOKOT S) 8.6-50 MG per tablet Take 1 tablet by mouth at bedtime as needed for mild constipation. (Patient not taking: Reported on 09/19/2016) 30 tablet 2  . simethicone (GAS-X) 80 MG chewable tablet Chew 1 tablet (80 mg total) by mouth every 6 (six) hours as needed for flatulence (take one tab at bed time everynight). (Patient not taking: Reported on 09/19/2016) 60 tablet 3  . Vitamin D, Ergocalciferol, (DRISDOL) 50000 UNITS CAPS capsule Take 1 capsule (50,000 Units total) by mouth every 7 (seven) days. (Patient not taking: Reported on 09/19/2016) 12 capsule 0  . naproxen (NAPROSYN) 500 MG tablet Take 1 po every 12 hours with food prn hip pain 60 tablet 1  . PARAGARD INTRAUTERINE COPPER IU 1 Intra Uterine Device by Intrauterine route.     No facility-administered medications prior  to visit.    ROS Review of Systems  Respiratory: Negative.   Cardiovascular: Negative.   Gastrointestinal: Negative.   Musculoskeletal: Positive for back pain (chronic back pain) and myalgias (left lower leg pain).   Objective:  BP (!) 144/79   Pulse 73   Temp 98.3 F (36.8 C) (Oral)   Resp 16   Wt 133 lb (60.3 kg)   LMP 07/13/2016   SpO2 97%   BMI 25.97 kg/m   BP/Weight 09/19/2016 07/21/2015 [id number]  Systolic BP 123456 [id number]  Diastolic BP 79 76 73  Wt. (Lbs) 133 138 134.8  BMI 25.97 26.95 26.33   Physical Exam  Constitutional: She is oriented to person, place, and time. She appears well-developed and well-nourished.  Cardiovascular: Normal rate, regular rhythm and normal heart sounds.   No murmur heard. Pulmonary/Chest: Effort normal and breath sounds normal.  Abdominal: Soft. Bowel sounds are normal. She exhibits no mass. There is no tenderness.  Musculoskeletal: She exhibits no edema or deformity.       Arms: LLE no swelling or increased warmth.   Neurological: She is alert and oriented to person, place, and time.  Reflex Scores:      Patellar reflexes are 2+ on the right side and 2+ on the left side.      Achilles reflexes are 2+ on the right side and 2+ on the left side. Skin: Skin is warm and dry.  Psychiatric: She has a normal mood and affect.  Her behavior is normal. Thought content normal.     Assessment & Plan:   Problem List Items Addressed This Visit    None    Visit Diagnoses    Low back pain radiating to left leg    -  Primary   Relevant Medications   naproxen (NAPROSYN) 500 MG tablet   Chronic left-sided low back pain, with sciatica presence unspecified       Relevant Medications   naproxen (NAPROSYN) 500 MG tablet   Other Relevant Orders   DG Lumbar Spine 2-3 Views   Healthcare maintenance       Relevant Orders   HIV antibody (with reflex)   Tdap vaccine greater than or equal to 7yo IM   Uses oral contraceptives as primary birth control  method       Relevant Orders   POCT urine pregnancy (Completed)     Meds ordered this encounter  Medications  . naproxen (NAPROSYN) 500 MG tablet    Sig: Take 1 tablet (500 mg total) by mouth 2 (two) times daily with a meal.    Dispense:  30 tablet    Refill:  1    Order Specific Question:   Supervising Provider    Answer:   Tresa Garter [id number]    Follow-up: As needed.   Alfonse Spruce FNP

## 2016-09-19 NOTE — Patient Instructions (Signed)
Musculoskeletal Pain Musculoskeletal pain is muscle and bone aches and pains. This pain can occur in any part of the body. Follow these instructions at home:  Only take medicines for pain, discomfort, or fever as told by your health care provider.  You may continue all activities unless the activities cause more pain. When the pain lessens, slowly resume normal activities. Gradually increase the intensity and duration of the activities or exercise.  During periods of severe pain, bed rest may be helpful. Lie or sit in any position that is comfortable, but get out of bed and walk around at least every several hours.  If directed, put ice on the injured area.  Put ice in a plastic bag.  Place a towel between your skin and the bag.  Leave the ice on for 20 minutes, 2-3 times a day. Contact a health care provider if:  Your pain is getting worse.  Your pain is not relieved with medicines.  You lose function in the area of the pain if the pain is in your arms, legs, or neck. This information is not intended to replace advice given to you by your health care provider. Make sure you discuss any questions you have with your health care provider. Document Released: 09/19/2005 Document Revised: 03/01/2016 Document Reviewed: 05/24/2013 Elsevier Interactive Patient Education  2017 [place]. Back Exercises Introduction If you have pain in your back, do these exercises 2-3 times each day or as told by your doctor. When the pain goes away, do the exercises once each day, but repeat the steps more times for each exercise (do more repetitions). If you do not have pain in your back, do these exercises once each day or as told by your doctor. Exercises Single Knee to Chest  Do these steps 3-5 times in a row for each leg: 1. Lie on your back on a firm bed or the floor with your legs stretched out. 2. Bring one knee to your chest. 3. Hold your knee to your chest by grabbing your knee or  thigh. 4. Pull on your knee until you feel a gentle stretch in your lower back. 5. Keep doing the stretch for 10-30 seconds. 6. Slowly let go of your leg and straighten it. Pelvic Tilt  Do these steps 5-10 times in a row: 1. Lie on your back on a firm bed or the floor with your legs stretched out. 2. Bend your knees so they point up to the ceiling. Your feet should be flat on the floor. 3. Tighten your lower belly (abdomen) muscles to press your lower back against the floor. This will make your tailbone point up to the ceiling instead of pointing down to your feet or the floor. 4. Stay in this position for 5-10 seconds while you gently tighten your muscles and breathe evenly. Cat-Cow  Do these steps until your lower back bends more easily: 1. Get on your hands and knees on a firm surface. Keep your hands under your shoulders, and keep your knees under your hips. You may put padding under your knees. 2. Let your head hang down, and make your tailbone point down to the floor so your lower back is round like the back of a cat. 3. Stay in this position for 5 seconds. 4. Slowly lift your head and make your tailbone point up to the ceiling so your back hangs low (sags) like the back of a cow. 5. Stay in this position for 5 seconds. Press-Ups  Do these  steps 5-10 times in a row: 1. Lie on your belly (face-down) on the floor. 2. Place your hands near your head, about shoulder-width apart. 3. While you keep your back relaxed and keep your hips on the floor, slowly straighten your arms to raise the top half of your body and lift your shoulders. Do not use your back muscles. To make yourself more comfortable, you may change where you place your hands. 4. Stay in this position for 5 seconds. 5. Slowly return to lying flat on the floor. Bridges  Do these steps 10 times in a row: 1. Lie on your back on a firm surface. 2. Bend your knees so they point up to the ceiling. Your feet should be flat on the  floor. 3. Tighten your butt muscles and lift your butt off of the floor until your waist is almost as high as your knees. If you do not feel the muscles working in your butt and the back of your thighs, slide your feet 1-2 inches farther away from your butt. 4. Stay in this position for 3-5 seconds. 5. Slowly lower your butt to the floor, and let your butt muscles relax. If this exercise is too easy, try doing it with your arms crossed over your chest. Belly Crunches  Do these steps 5-10 times in a row: 1. Lie on your back on a firm bed or the floor with your legs stretched out. 2. Bend your knees so they point up to the ceiling. Your feet should be flat on the floor. 3. Cross your arms over your chest. 4. Tip your chin a little bit toward your chest but do not bend your neck. 5. Tighten your belly muscles and slowly raise your chest just enough to lift your shoulder blades a tiny bit off of the floor. 6. Slowly lower your chest and your head to the floor. Back Lifts  Do these steps 5-10 times in a row: 1. Lie on your belly (face-down) with your arms at your sides, and rest your forehead on the floor. 2. Tighten the muscles in your legs and your butt. 3. Slowly lift your chest off of the floor while you keep your hips on the floor. Keep the back of your head in line with the curve in your back. Look at the floor while you do this. 4. Stay in this position for 3-5 seconds. 5. Slowly lower your chest and your face to the floor. Contact a doctor if:  Your back pain gets a lot worse when you do an exercise.  Your back pain does not lessen 2 hours after you exercise. If you have any of these problems, stop doing the exercises. Do not do them again unless your doctor says it is okay. Get help right away if:  You have sudden, very bad back pain. If this happens, stop doing the exercises. Do not do them again unless your doctor says it is okay. This information is not intended to replace advice  given to you by your health care provider. Make sure you discuss any questions you have with your health care provider. Document Released: 10/22/2010 Document Revised: 02/25/2016 Document Reviewed: 11/13/2014  2017 Elsevier

## 2016-09-20 LAB — HIV ANTIBODY (ROUTINE TESTING W REFLEX): HIV 1&2 Ab, 4th Generation: NONREACTIVE

## 2016-10-05 MED FILL — NAPROXEN 500 MG TABLET: 500 | 15 days supply | Qty: 30 | Fill #1

## 2016-10-07 ENCOUNTER — Telehealth: Payer: Self-pay

## 2016-10-07 NOTE — Telephone Encounter (Signed)
-----   Message from Lauren Petersen, [place] sent at 10/07/2016  9:48 AM EST ----- Please inform patient of xray showing no bone abnormalities. Patient is negative for HIV. She may follow up if symptoms do not improve.

## 2016-10-07 NOTE — Progress Notes (Signed)
Please inform patient of xray showing no bone abnormalities. Patient is negative for HIV. She may follow up if symptoms do not improve.

## 2016-10-07 NOTE — Telephone Encounter (Signed)
Interpreter name ammar  ID [phone]  Patient Verify DOB  Patient was aware of her x ray results and that she can follow up if symptoms do not improve.

## 2016-10-14 ENCOUNTER — Ambulatory Visit: Payer: Self-pay | Attending: Family Medicine

## 2016-11-22 ENCOUNTER — Other Ambulatory Visit: Payer: Self-pay | Admitting: Family Medicine

## 2016-11-22 DIAGNOSIS — Z1231 Encounter for screening mammogram for malignant neoplasm of breast: Secondary | ICD-10-CM

## 2016-12-12 ENCOUNTER — Ambulatory Visit: Payer: Self-pay | Admitting: Family Medicine

## 2016-12-13 ENCOUNTER — Ambulatory Visit: Payer: Self-pay | Attending: Family Medicine | Admitting: Family Medicine

## 2016-12-13 ENCOUNTER — Encounter: Payer: Self-pay | Admitting: Family Medicine

## 2016-12-13 ENCOUNTER — Ambulatory Visit: Payer: Self-pay | Admitting: Family Medicine

## 2016-12-13 VITALS — BP 147/83 | HR 74 | Temp 98.3°F | Resp 16 | Ht 59.0 in | Wt 139.0 lb

## 2016-12-13 DIAGNOSIS — M25552 Pain in left hip: Secondary | ICD-10-CM | POA: Insufficient documentation

## 2016-12-13 DIAGNOSIS — I10 Essential (primary) hypertension: Secondary | ICD-10-CM | POA: Insufficient documentation

## 2016-12-13 DIAGNOSIS — R143 Flatulence: Secondary | ICD-10-CM | POA: Insufficient documentation

## 2016-12-13 DIAGNOSIS — G8929 Other chronic pain: Secondary | ICD-10-CM

## 2016-12-13 DIAGNOSIS — Z79899 Other long term (current) drug therapy: Secondary | ICD-10-CM | POA: Insufficient documentation

## 2016-12-13 DIAGNOSIS — M5442 Lumbago with sciatica, left side: Secondary | ICD-10-CM | POA: Insufficient documentation

## 2016-12-13 LAB — BASIC METABOLIC PANEL WITH GFR
BUN: 9 mg/dL (ref 7–25)
CHLORIDE: 103 mmol/L (ref 98–110)
CO2: 26 mmol/L (ref 20–31)
Calcium: 9.3 mg/dL (ref 8.6–10.2)
Creat: 0.79 mg/dL (ref 0.50–1.10)
GFR, EST NON AFRICAN AMERICAN: 88 mL/min (ref >=60)
GLUCOSE: 94 mg/dL (ref 65–99)
POTASSIUM: 3.9 mmol/L (ref 3.5–5.3)
Sodium: 137 mmol/L (ref 135–146)

## 2016-12-13 LAB — LIPID PANEL
CHOL/HDL RATIO: 3.5 ratio (ref <5.0)
Cholesterol: 139 mg/dL (ref <200)
HDL: 40 mg/dL — AB (ref >50)
LDL Cholesterol: 64 mg/dL (ref <100)
Triglycerides: 174 mg/dL — ABNORMAL HIGH (ref <150)
VLDL: 35 mg/dL — AB (ref <30)

## 2016-12-13 MED ORDER — SIMETHICONE 80 MG PO CHEW: 80.0000 mg | CHEWABLE_TABLET | Freq: Four times a day (QID) | ORAL | 3 refills | Status: DC | PRN

## 2016-12-13 MED ORDER — HYDROCHLOROTHIAZIDE 25 MG PO TABS: 25.0000 mg | ORAL_TABLET | Freq: Every day | ORAL | 2 refills | Status: DC

## 2016-12-13 MED ORDER — NAPROXEN 500 MG PO TABS: 500.0000 mg | ORAL_TABLET | Freq: Two times a day (BID) | ORAL | 0 refills | Status: DC

## 2016-12-13 MED FILL — ?NAPROXEN 500 MG TAB: 500 MG | 30 days supply | Qty: 60 | Fill #0

## 2016-12-13 MED FILL — HYDROCHLOROTHIAZIDE 25 MG T: 25 | 30 days supply | Qty: 30 | Fill #0

## 2016-12-13 NOTE — Progress Notes (Signed)
Subjective:  Patient ID: Lauren Petersen, female    DOB: 23-Dec-1965  Age: 51 y.o. MRN: [id number]  CC: No chief complaint on file.   HPI Lauren Petersen presents for   Musculoskeletal pain: Referral to orthopedics at last office visit in Dec. 2017. Patient reports not going.   Left hip pain and lower leg: 2 to 3 years chronic pain. No history of injury. Standing and walking for long periods of time make it worse.Takes Tylenol or ibuprofen for pain with minimal relief of symptoms.   HTN: Blood pressure found to be elevated this visit. Denies any CP, SOB, or swelling of the BLE. She is an non-smoker.    Outpatient Medications Prior to Visit  Medication Sig Dispense Refill  . amoxicillin (AMOXIL) 500 MG capsule Take 1 capsule (500 mg total) by mouth 3 (three) times daily. (Patient not taking: Reported on 09/19/2016) 21 capsule 0  . fluconazole (DIFLUCAN) 150 MG tablet Take 1 tablet (150 mg total) by mouth once. (Patient not taking: Reported on 09/19/2016) 1 tablet 1  . Norethindrone-Eth Estradiol (VYFEMLA PO) Take 1 tablet by mouth daily.    Marland Kitchen omeprazole (PRILOSEC) 40 MG capsule Take 1 capsule (40 mg total) by mouth at bedtime. (Patient not taking: Reported on 09/19/2016) 30 capsule 3  . senna-docusate (SENOKOT S) 8.6-50 MG per tablet Take 1 tablet by mouth at bedtime as needed for mild constipation. (Patient not taking: Reported on 09/19/2016) 30 tablet 2  . Vitamin D, Ergocalciferol, (DRISDOL) 50000 UNITS CAPS capsule Take 1 capsule (50,000 Units total) by mouth every 7 (seven) days. (Patient not taking: Reported on 09/19/2016) 12 capsule 0  . naproxen (NAPROSYN) 500 MG tablet Take 1 tablet (500 mg total) by mouth 2 (two) times daily with a meal. (Patient not taking: Reported on 12/13/2016) 30 tablet 1  . simethicone (GAS-X) 80 MG chewable tablet Chew 1 tablet (80 mg total) by mouth every 6 (six) hours as needed for flatulence (take one tab at bed time everynight). (Patient not taking:  Reported on 09/19/2016) 60 tablet 3   No facility-administered medications prior to visit.     ROS Review of Systems  Respiratory: Negative.   Cardiovascular: Negative.   Gastrointestinal: Negative.   Musculoskeletal: Positive for arthralgias, back pain and myalgias.   Objective:  BP (!) 147/83 (BP Location: Left Arm, Patient Position: Sitting, Cuff Size: Normal)   Pulse 74   Temp 98.3 F (36.8 C) (Oral)   Resp 16   Ht 4\' 11"  (1.499 m)   Wt 139 lb (63 kg)   LMP 12/11/2016 Comment: spotting on Sunday  SpO2 99%   BMI 28.07 kg/m   BP/Weight 12/13/2016 09/19/2016 15/17/6160  Systolic BP 737 106 269  Diastolic BP 83 79 76  Wt. (Lbs) 139 133 138  BMI 28.07 25.97 26.95    Physical Exam  Eyes: Conjunctivae are normal. Pupils are equal, round, and reactive to light.  Neck: No JVD present.  Cardiovascular: Normal rate, regular rhythm, normal heart sounds and intact distal pulses.   Pulmonary/Chest: Effort normal and breath sounds normal.  Abdominal: Soft. Bowel sounds are normal.  Musculoskeletal:       Lumbar back: She exhibits pain.  Left hip pain with adduction/abduction.  Skin: Skin is warm and dry.  Nursing note and vitals reviewed.  Assessment & Plan:   Problem List Items Addressed This Visit      Other   Flatulence   Relevant Medications   simethicone (GAS-X) 80 MG chewable  tablet    Other Visit Diagnoses    Chronic left-sided low back pain with left-sided sciatica    -  Primary   Relevant Medications   naproxen (NAPROSYN) 500 MG tablet   Other Relevant Orders   Ambulatory referral to Orthopedics   Left hip pain       Relevant Medications   naproxen (NAPROSYN) 500 MG tablet   Other Relevant Orders   Ambulatory referral to Orthopedics   Essential hypertension       Schedule BP recheck in 2 weeks with RN.   If BP is greater than 90/60 (MAP 65 or greater) but not less than 130/80 may increase dose   of hydrochlorothiazide to 50 mg and recheck in another 2  weeks with RN.    Relevant Medications   hydrochlorothiazide (HYDRODIURIL) 25 MG tablet   Other Relevant Orders   BASIC METABOLIC PANEL WITH GFR (Completed)   Lipid Panel (Completed)   Microalbumin/Creatinine Ratio, Urine (Completed)      Meds ordered this encounter  Medications  . naproxen (NAPROSYN) 500 MG tablet    Sig: Take 1 tablet (500 mg total) by mouth 2 (two) times daily with a meal.    Dispense:  60 tablet    Refill:  0    Order Specific Question:   Supervising Provider    Answer:   Tresa Garter [id number]  . simethicone (GAS-X) 80 MG chewable tablet    Sig: Chew 1 tablet (80 mg total) by mouth every 6 (six) hours as needed for flatulence (take one tab at bed time everynight).    Dispense:  60 tablet    Refill:  3    Order Specific Question:   Supervising Provider    Answer:   Tresa Garter [id number]  . hydrochlorothiazide (HYDRODIURIL) 25 MG tablet    Sig: Take 1 tablet (25 mg total) by mouth daily.    Dispense:  30 tablet    Refill:  2    Order Specific Question:   Supervising Provider    Answer:   Tresa Garter [id number]    Follow-up: Return  if symptoms worsen or fail to improve. in about 2 weeks (around 12/27/2016) for Blood Pressure Check with RN.  Alfonse Spruce FNP

## 2016-12-13 NOTE — Patient Instructions (Signed)
Schedule follow up appointment in 2 weeks  with nurse for blood pressure check.

## 2016-12-14 ENCOUNTER — Other Ambulatory Visit: Payer: Self-pay | Admitting: Family Medicine

## 2016-12-14 DIAGNOSIS — E785 Hyperlipidemia, unspecified: Secondary | ICD-10-CM

## 2016-12-14 LAB — MICROALBUMIN / CREATININE URINE RATIO
CREATININE, URINE: 59 mg/dL (ref 20–320)
MICROALB UR: 0.3 mg/dL
MICROALB/CREAT RATIO: 5 ug/mg{creat} (ref <30)

## 2016-12-16 ENCOUNTER — Telehealth: Payer: Self-pay

## 2016-12-16 NOTE — Telephone Encounter (Signed)
-----   Message from Alfonse Spruce, [place] sent at 12/14/2016 12:32 PM EDT ----- Microalbumin/creatinine ratio level was normal. This tests for protein in your urine that can indicate early signs of kidney damage.  Kidney function normal Lipid levels were elevated. This can increase your risk of heart disease overtime. Start eating a diet low in saturated fat. Limit your intake of fried foods, red meats, and whole milk. Recommend recheck in 6 months.

## 2016-12-16 NOTE — Telephone Encounter (Signed)
CMA call to go over lab results  interpreter Zenon Mayo ID  [phone]  interpreter call patient   Patient was aware and understood

## 2017-01-02 ENCOUNTER — Ambulatory Visit: Payer: Self-pay | Attending: Family Medicine | Admitting: *Deleted

## 2017-01-02 ENCOUNTER — Ambulatory Visit: Payer: Self-pay

## 2017-01-02 VITALS — BP 130/70 | HR 77

## 2017-01-02 DIAGNOSIS — I10 Essential (primary) hypertension: Secondary | ICD-10-CM | POA: Insufficient documentation

## 2017-01-02 NOTE — Progress Notes (Signed)
Pt arrived for blood pressure check.  BP reading: 130/77, manually P: 77 Pt states she has not taken medication this morning but states she takes them daily.   Pt denies chest pain, SOB, HA, new vison concerns, or generalized swelling. Nurse visit will be routed to provider.Carilyn Goodpasture, RN, BSN

## 2017-01-11 ENCOUNTER — Encounter (INDEPENDENT_AMBULATORY_CARE_PROVIDER_SITE_OTHER): Payer: Self-pay | Admitting: Orthopaedic Surgery

## 2017-01-11 ENCOUNTER — Ambulatory Visit (INDEPENDENT_AMBULATORY_CARE_PROVIDER_SITE_OTHER): Payer: Self-pay | Admitting: Orthopaedic Surgery

## 2017-01-11 VITALS — BP 143/79 | HR 77 | Ht 62.0 in | Wt 140.0 lb

## 2017-01-11 DIAGNOSIS — M722 Plantar fascial fibromatosis: Secondary | ICD-10-CM

## 2017-01-11 DIAGNOSIS — M7062 Trochanteric bursitis, left hip: Secondary | ICD-10-CM

## 2017-01-11 DIAGNOSIS — M5416 Radiculopathy, lumbar region: Secondary | ICD-10-CM

## 2017-01-11 NOTE — Progress Notes (Signed)
Office Visit Note   Patient: Lauren Petersen           Date of Birth: 20-Sep-1966           MRN: [id number] Visit Date: 01/11/2017              Requested by: Alfonse Spruce, [address] [place], [place] [address] PCP: Fredia Beets, FNP   Assessment & Plan: Visit Diagnoses:  1. Radiculopathy, lumbar region   2. Plantar fasciitis, left   3. Greater trochanteric bursitis of left hip     Plan: With her chronic lumbar symptoms for 2+ years we'll schedule MRI to rule out HNP/stenosis. Follow-up after completion to discuss results. When she returns we will also discuss further treatment options for left plantar fasciitis and left hip greater trochanteric bursitis. We did discuss injections. We'll also get x-rays left foot and also AP pelvis when she returns.  Follow-Up Instructions: Return in about 3 weeks (around 02/01/2017) for For review of lumbar spine MRI and recheck left hip bursitis and left plantar fasciitis.   Orders:  Orders Placed This Encounter  Procedures  . MR Lumbar Spine w/o contrast   No orders of the defined types were placed in this encounter.     Procedures: No procedures performed   Clinical Data: No additional findings.   Subjective: Chief Complaint  Patient presents with  . Lower Back - Pain    HPI Patient comes in today with complaints of low back pain and left lower 70 radiculopathy. Lumbar symptoms off and on 2+ years and has been progressively worsening. Pain numbness and tingling in left leg is constant down to the left foot but aggravated with activity and prolonged sitting. No symptoms on the right. No feeling of leg weakness. Denies bowel or bladder incontinence. Patient also complaining of pain over the left lateral hip. Pain when she is ambulating and she avoids lying on her left side. States that she also has a chronic history of left plantar fasciitis that has been treated conservatively. Has not had injection or imaging studies  of her foot. She has been taking Naprosyn with minimal improvement of her symptoms. X-rays lumbar spine 09/19/2016 report read no acute bony normality identified. Normal alignment and mineralization. Patient has not had an MRI. She does come in with an interpreter today. Review of Systems  Constitutional: Negative.   HENT: Negative.   Respiratory: Negative.   Cardiovascular: Negative.   Gastrointestinal:       Irritable bowel syndrome  Genitourinary: Negative.   Musculoskeletal: Positive for back pain and gait problem.  Neurological: Positive for numbness.  Psychiatric/Behavioral: Negative.      Objective: Vital Signs: BP (!) 143/79   Pulse 77   Ht 5\' 2"  (1.575 m)   Wt 140 lb (63.5 kg)   BMI 25.61 kg/m   Physical Exam  Constitutional: She is oriented to person, place, and time. She appears well-developed and well-nourished. No distress.  HENT:  Head: Normocephalic and atraumatic.  Eyes: EOM are normal. Pupils are equal, round, and reactive to light.  Neck: Normal range of motion.  Pulmonary/Chest: No respiratory distress.  Musculoskeletal:  Gait is minimally antalgic. Negative logroll bilateral hips. Negative straight leg raise. She is moderately tender over the left hip trochanter bursa. Less tender over the right side. Bilateral  knees good range of motion. Bilateral calves are nontender. No focal motor deficits. Left foot she is moderate to markedly tender over the plantar fascial calcaneal insertion.  She also does have some tenderness over the Achilles tendon calcaneal insertion point. Mild pre-Achilles bursa tenderness. No palpable Achilles tendon defect. Right foot is nontender.  Neurological: She is alert and oriented to person, place, and time.  Skin: Skin is warm and dry.  Psychiatric: She has a normal mood and affect.    Ortho Exam  Specialty Comments:  No specialty comments available.  Imaging: No results found.   PMFS History: Patient Active Problem List    Diagnosis Date Noted  . Flatulence 09/11/2013  . GERD (gastroesophageal reflux disease) 09/11/2013  . Pain due to dental caries 07/18/2013  . Acute pharyngitis 04/02/2013  . Sinusitis, acute 04/02/2013  . Streptococcal sore throat 04/02/2013  . Other malaise and fatigue 04/02/2013   No past medical history on file.  No family history on file.  Past Surgical History:  Procedure Laterality Date  . CESAREAN SECTION     Social History   Occupational History  . Not on file.   Social History Main Topics  . Smoking status: Never Smoker  . Smokeless tobacco: Never Used  . Alcohol use No  . Drug use: No  . Sexual activity: Yes    Birth control/ protection: IUD

## 2017-01-13 MED FILL — HYDROCHLOROTHIAZIDE 25 MG T: 25 | 30 days supply | Qty: 30 | Fill #1

## 2017-01-20 ENCOUNTER — Ambulatory Visit (HOSPITAL_COMMUNITY)
Admission: RE | Admit: 2017-01-20 | Discharge: 2017-01-20 | Disposition: A | Discharge status: Home / Self Care | Payer: Self-pay | Source: Ambulatory Visit | Attending: Surgery | Admitting: Surgery

## 2017-01-20 DIAGNOSIS — M5136 Other intervertebral disc degeneration, lumbar region: Secondary | ICD-10-CM | POA: Insufficient documentation

## 2017-01-20 DIAGNOSIS — M5416 Radiculopathy, lumbar region: Secondary | ICD-10-CM | POA: Insufficient documentation

## 2017-02-15 ENCOUNTER — Encounter (INDEPENDENT_AMBULATORY_CARE_PROVIDER_SITE_OTHER): Payer: Self-pay | Admitting: Orthopaedic Surgery

## 2017-02-15 ENCOUNTER — Ambulatory Visit (INDEPENDENT_AMBULATORY_CARE_PROVIDER_SITE_OTHER): Payer: Self-pay

## 2017-02-15 ENCOUNTER — Ambulatory Visit (INDEPENDENT_AMBULATORY_CARE_PROVIDER_SITE_OTHER): Payer: Self-pay | Admitting: Orthopaedic Surgery

## 2017-02-15 VITALS — BP 122/72 | HR 67 | Ht 59.0 in | Wt 139.0 lb

## 2017-02-15 DIAGNOSIS — M545 Low back pain, unspecified: Secondary | ICD-10-CM | POA: Insufficient documentation

## 2017-02-15 DIAGNOSIS — M79672 Pain in left foot: Secondary | ICD-10-CM | POA: Insufficient documentation

## 2017-02-15 DIAGNOSIS — M25552 Pain in left hip: Secondary | ICD-10-CM

## 2017-02-15 NOTE — Addendum Note (Signed)
Addended by: Meyer Cory on: 02/15/2017 01:01 PM   Modules accepted: Orders

## 2017-02-15 NOTE — Progress Notes (Signed)
Office Visit Note   Patient: Lauren Petersen           Date of Birth: May 25, 1966           MRN: [id number] Visit Date: 02/15/2017              Requested by: Alfonse Spruce, [place], [place] [address] PCP: Alfonse Spruce, FNP   Assessment & Plan: Visit Diagnoses:  1. Pain in left foot   2. Pain in left hip     Plan: MRI scan is reviewed with patient as well as interpreter. Prescription for physical therapy written. She'll work on a walking program. We discussed appropriate inserts to help with her plantar fasciitis on the left heel. She'll work on a Geologist, engineering program to help her back. MRI scan showed the tiny disc protrusion at L4-5 without compression which could possibly give her some of her symptoms. No evidence of the lateral recess, central or foraminal stenosis. No significant facet arthropathy. Follow-Up Instructions: PRN  Orders:  Orders Placed This Encounter  Procedures  . XR Pelvis 1-2 Views  . XR Foot Complete Left   No orders of the defined types were placed in this encounter.     Procedures: No procedures performed   Clinical Data: No additional findings.   Subjective: Chief Complaint  Patient presents with  . Lower Back - Pain  . Left Foot - Pain  . Left Hip - Pain    HPI patient returns and has the pain in her back also pain in her left heel. Her MRI scan is  available for review . She has no bowel or bladder symptoms no fever chills back seems to bother more when she walks a lot she's not done any therapy. No history of injury. Heel bothers her more in the morning tends to get better during the day.  Review of Systems updated and unchanged 14 point from her last office visit   Objective: Vital Signs: BP 122/72   Pulse 67   Ht 4\' 11"  (1.499 m)   Wt 139 lb (63 kg)   BMI 28.07 kg/m   Physical Exam  Constitutional: She is oriented to person, place, and time. She appears well-developed.    HENT:  Head: Normocephalic.  Right Ear: External ear normal.  Left Ear: External ear normal.  Eyes: Pupils are equal, round, and reactive to light.  Neck: No tracheal deviation present. No thyromegaly present.  Cardiovascular: Normal rate.   Pulmonary/Chest: Effort normal.  Abdominal: Soft.  Musculoskeletal:  History leg raising 90 no pain with hip range of motion. These reach full extension. She has some tenderness over the plantar fascia origin on the left heel. None on the right. Posterior tibial anterior tib gastrocsoleus is strong. No pitting edema distal pulses are intact.  Neurological: She is alert and oriented to person, place, and time.  Skin: Skin is warm and dry.  Psychiatric: She has a normal mood and affect. Her behavior is normal.    Ortho Exam  Specialty Comments:  No specialty comments available.  Imaging: Show images for MR Lumbar Spine w/o contrast  Study Result   CLINICAL DATA:  Chronic low back pain with radiation to the left lower extremity  EXAM: MRI LUMBAR SPINE WITHOUT CONTRAST  TECHNIQUE: Multiplanar, multisequence MR imaging of the lumbar spine was performed. No intravenous contrast was administered.  COMPARISON:  Lumbar spine radiograph 09/19/2016  FINDINGS: Segmentation: Comparison radiograph shows 6 non-rib-bearing  vertebral bodies. 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  Alignment:  Normal  Vertebrae: No acute compression fracture, discitis-osteomyelitis, facet edema or other focal marrow lesion. No epidural collection.  Conus medullaris: Extends to the L1 level and appears normal.  Paraspinal and other soft tissues: The visualized aorta, IVC and iliac vessels are normal. The visualized retroperitoneal organs and paraspinal soft tissues are normal.  Disc levels:  T12-L1: Normal disc  space and facets. No spinal canal or neuroforaminal stenosis.  L1-L2: Normal disc space and facets. No spinal canal or neuroforaminal stenosis.  L2-L3: Normal disc space and facets. No spinal canal or neuroforaminal stenosis.  L3-L4: Normal disc space and facets. No spinal canal or neuroforaminal stenosis.  L4-L5: Small central disc protrusion. No spinal canal stenosis. No neural impingement.  L5-S1: Normal disc space and facets. No spinal canal or neuroforaminal stenosis.  Visualized sacrum: Normal.  IMPRESSION: 1. Transitional lumbosacral anatomy. 2. Mild lumbar degenerative disc disease without spinal canal or neural foraminal stenosis. 3. No specific finding to explain the reported left lower extremity radiculopathy.   Electronically Signed   By: Ulyses Jarred M.D.   On: 01/20/2017 18:46       PMFS History: Patient Active Problem List   Diagnosis Date Noted  . Flatulence 09/11/2013  . GERD (gastroesophageal reflux disease) 09/11/2013  . Pain due to dental caries 07/18/2013  . Acute pharyngitis 04/02/2013  . Sinusitis, acute 04/02/2013  . Streptococcal sore throat 04/02/2013  . Other malaise and fatigue 04/02/2013   No past medical history on file.  No family history on file.  Past Surgical History:  Procedure Laterality Date  . CESAREAN SECTION     Social History   Occupational History  . Not on file.   Social History Main Topics  . Smoking status: Never Smoker  . Smokeless tobacco: Never Used  . Alcohol use No  . Drug use: No  . Sexual activity: Yes    Birth control/ protection: IUD

## 2017-02-22 ENCOUNTER — Other Ambulatory Visit: Payer: Self-pay | Admitting: Critical Care Medicine

## 2017-02-22 ENCOUNTER — Ambulatory Visit: Payer: Self-pay

## 2017-02-22 DIAGNOSIS — I10 Essential (primary) hypertension: Secondary | ICD-10-CM

## 2017-02-22 MED ORDER — HYDROCHLOROTHIAZIDE 25 MG PO TABS
25.0000 mg | ORAL_TABLET | Freq: Every day | ORAL | 6 refills | Status: DC
Start: 2017-02-22 — End: 2018-09-21

## 2017-02-22 MED FILL — HYDROCHLOROTHIAZIDE 25 MG T: 25 | 30 days supply | Qty: 30 | Fill #0

## 2017-02-23 ENCOUNTER — Ambulatory Visit: Payer: No Typology Code available for payment source | Attending: Orthopaedic Surgery | Admitting: Physical Therapy

## 2017-02-23 ENCOUNTER — Encounter: Payer: Self-pay | Admitting: Physical Therapy

## 2017-02-23 DIAGNOSIS — M5441 Lumbago with sciatica, right side: Secondary | ICD-10-CM | POA: Insufficient documentation

## 2017-02-23 DIAGNOSIS — M62830 Muscle spasm of back: Secondary | ICD-10-CM

## 2017-02-23 DIAGNOSIS — G8929 Other chronic pain: Secondary | ICD-10-CM | POA: Insufficient documentation

## 2017-02-23 DIAGNOSIS — R262 Difficulty in walking, not elsewhere classified: Secondary | ICD-10-CM | POA: Insufficient documentation

## 2017-02-24 NOTE — Therapy (Signed)
[place], [place], [address] Phone: [phone]   Fax:  [phone]  Physical Therapy Evaluation  Patient Details  Name: Lauren Petersen MRN: [id number] Date of Birth: August 07, 1966 Referring Provider: Dr Rodell Perna  Encounter Date: 02/23/2017      PT End of Session - 02/24/17 0915    Visit Number 1   Number of Visits 16   Date for PT Re-Evaluation 04/21/17   Authorization Type CAFA 04/03/2017   PT Start Time 1147   PT Stop Time 1240   PT Time Calculation (min) 53 min   Activity Tolerance Patient tolerated treatment well   Behavior During Therapy [facility] for tasks assessed/performed      History reviewed. No pertinent past medical history.  Past Surgical History:  Procedure Laterality Date  . CESAREAN SECTION      There were no vitals filed for this visit.       Subjective Assessment - 02/23/17 1158    Subjective Pateint reports a 4 year histroy of lower back pain. Her pain is on the right an runs down her right leg. She worked in a job in the past were she had to do heavey lifting. She is currently out of work.    Patient is accompained by: Family member;Interpreter   Pertinent History chronic left goot pain    Limitations Standing;Walking;Lifting;House hold activities   How long can you stand comfortably? Standing for a long period of time and walking   How long can you walk comfortably? Medication    Diagnostic tests Lumbar MRI without contrast: No major deficits seen in the lumbar spine    Patient Stated Goals To have less pain    Currently in Pain? Yes   Pain Score 6    Pain Location Back   Pain Orientation Right   Pain Descriptors / Indicators Aching   Pain Type Chronic pain   Pain Onset More than a month ago   Pain Frequency Constant   Aggravating Factors  Lifting, standing, walking    Pain Relieving Factors rest, pain medication   Effect of Pain on Daily Activities diffciulty perfroming  ADL;s             [facility] PT Assessment - 02/24/17 0001      Assessment   Medical Diagnosis Low back pain with right sided Sciatica    Referring Provider Dr Rodell Perna   Onset Date/Surgical Date --  4 years prior the patient began having pain    Hand Dominance Right   Next MD Visit Does not recall    Prior Therapy None      Precautions   Precautions None     Restrictions   Weight Bearing Restrictions No     Balance Screen   Has the patient fallen in the past 6 months No   Has the patient had a decrease in activity level because of a fear of falling?  No   Is the patient reluctant to leave their home because of a fear of falling?  No     Home Environment   Additional Comments Lives at home. Nothing significnat      Prior Function   Level of Independence Independent   Vocation On disability   Vocation Requirements Used to perfrom work with heavey lifting    Leisure Nothing at this time      Cognition   Overall Cognitive Status Within Functional Limits for tasks assessed   Attention Focused  Focused Attention Appears intact   Memory Appears intact   Awareness Appears intact   Problem Solving Appears intact     Observation/Other Assessments   Focus on Therapeutic Outcomes (FOTO)  speaks arabic only      Sensation   Light Touch Appears Intact   Additional Comments Numbness into her entire leg. She can not specify were      Coordination   Gross Motor Movements are Fluid and Coordinated Yes   Fine Motor Movements are Fluid and Coordinated Yes     Posture/Postural Control   Posture Comments Rounded shoulders; Even plvis. No rotations noted     AROM   Lumbar Flexion 25 % limited with pain    Lumbar Extension 50% limited with pain    Lumbar - Right Side Bend Pain    Lumbar - Left Side Bend more pain cmpared to the left    Lumbar - Right Rotation 25% limited with pain    Lumbar - Left Rotation 50% limited with pain      PROM   Overall PROM Comments significant  limitations in Hip IR on the left. Normal on the right.      Strength   Overall Strength Deficits   Right Hip Flexion 5/5   Right Hip ABduction 5/5   Right Hip ADduction 5/5   Left Hip Flexion 3+/5   Left Hip ABduction 4/5   Left Hip ADduction 4+/5     Flexibility   Soft Tissue Assessment /Muscle Length yes     Palpation   Palpation comment tenderenss to palpation in the lateral hip into her lumbar paraspinals.      Ambulation/Gait   Gait Comments decreased single leg stance time on the left; decreased right hip flexion.                    [place] [facility] PT Treatment/Exercise - 02/24/17 0001      Lumbar Exercises: Stretches   Passive Hamstring Stretch Limitations seated hamstring stretch 3x20sec hold    Lower Trunk Rotation Limitations x10    Piriformis Stretch Limitations 3x20 second hold      Modalities   Modalities Moist Heat     Moist Heat Therapy   Number Minutes Moist Heat 10 Minutes   Moist Heat Location Lumbar Spine                PT Education - 02/24/17 0845    Education provided Yes   Education Details importance of symptom mangement, HEP, rationale behind stretching and strengthening    Person(s) Educated Patient   Methods Explanation;Demonstration   Comprehension Verbalized understanding;Returned demonstration          PT Short Term Goals - 02/24/17 1113      PT SHORT TERM GOAL #1   Title Patient will be independent with HEP    Time 4   Period Weeks   Status New     PT SHORT TERM GOAL #2   Title Patient will increase bilateral hip strength to 4+/5    Time 4   Period Weeks   Status New     PT SHORT TERM GOAL #3   Title Patient will increase right hip internal rotation by 10 degrees    Baseline 5 degrees with pain    Time 4   Period Weeks   Status New           PT Long Term Goals - 02/24/17 1114      PT LONG TERM  GOAL #1   Title Patient will stand for 1/2 hour without a significant increase in pain    Time 8    Period Weeks   Status New     PT LONG TERM GOAL #2   Title Patient will rotatte to the left and right without increased pain in order to perfrom ADL;s    Time 8   Period Weeks   Status New     PT LONG TERM GOAL #3   Title Patient will walk 1 mile without self report of pain   Time 8   Period Weeks   Status New               Plan - 02/23/17 1549    Clinical Impression Statement Patient is a 51 year old female who presents with right sided lumbar pain and pain down her right leg. Her pain is worse with rotation and sidebending to the left. Her MRI showed no major abnormalities. 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 She has weakness with left hip flexion and left hip abdcution. She has increased pain with walking and standing.  She would benefit from skilled therapy to improve strength and overall mobility. She was seen for a low complexity evaluation.    Rehab Potential Good   PT Frequency 2x / week   PT Duration 8 weeks   PT Treatment/Interventions ADLs/Self Care Home Management;Cryotherapy;Electrical Stimulation;Iontophoresis 4mg /ml Dexamethasone;Ultrasound;Gait training;Stair training;Functional mobility training;Patient/family education;Neuromuscular re-education;Therapeutic activities;Therapeutic exercise;Manual techniques;Passive range of motion;Dry needling;Taping   PT Next Visit Plan Assess tolerance to the-ex. Patient only given stretches 2nd to pain on the first visit. Review abdominal breathing, Consdier adding supine clams, supine march; posterior pelvi tilt; ball squeeze with abdominla brace; consider soft tissue mobilization if indicated.    PT Home Exercise Plan lateral trunk rotation; seated hamstring stretch; pirfromis stretch    Consulted and Agree with Plan of Care Patient      Patient will benefit from skilled therapeutic intervention in order to improve the following deficits and impairments:  Decreased range of motion, Difficulty walking,  Decreased strength, Decreased activity tolerance, Increased muscle spasms, Pain  Visit Diagnosis: Chronic right-sided low back pain with right-sided sciatica - Plan: PT plan of care cert/re-cert  Muscle spasm of back - Plan: PT plan of care cert/re-cert  Difficulty in walking, not elsewhere classified - Plan: PT plan of care cert/re-cert     Problem List Patient Active Problem List   Diagnosis Date Noted  . Pain in left foot 02/15/2017  . Low back pain without sciatica 02/15/2017  . Flatulence 09/11/2013  . GERD (gastroesophageal reflux disease) 09/11/2013  . Pain due to dental caries 07/18/2013  . Acute pharyngitis 04/02/2013  . Sinusitis, acute 04/02/2013  . Streptococcal sore throat 04/02/2013  . Other malaise and fatigue 04/02/2013    Carney Living PT DPT  02/24/2017, 11:28 AM  [facility] [address] [place], [place], [address] Phone: [phone]   Fax:  [phone]  Name: Lauren Petersen MRN: [id number] Date of Birth: Jul 18, 1966

## 2017-02-28 ENCOUNTER — Ambulatory Visit: Payer: No Typology Code available for payment source | Admitting: Physical Therapy

## 2017-02-28 ENCOUNTER — Encounter: Payer: Self-pay | Admitting: Physical Therapy

## 2017-02-28 DIAGNOSIS — M62830 Muscle spasm of back: Secondary | ICD-10-CM

## 2017-02-28 DIAGNOSIS — M5441 Lumbago with sciatica, right side: Principal | ICD-10-CM

## 2017-02-28 DIAGNOSIS — R262 Difficulty in walking, not elsewhere classified: Secondary | ICD-10-CM

## 2017-02-28 DIAGNOSIS — G8929 Other chronic pain: Secondary | ICD-10-CM

## 2017-02-28 NOTE — Therapy (Signed)
[place], [place], [address] Phone: [phone]   Fax:  [phone]  Physical Therapy Treatment  Patient Details  Name: Lauren Petersen MRN: [id number] Date of Birth: [birth year] Referring Provider: Dr. Rodell Perna  Encounter Date: 02/28/2017      PT End of Session - 02/28/17 1218    Visit Number 2   Number of Visits 16   Date for PT Re-Evaluation 04/21/17   Authorization Type CAFA 04/03/2017   PT Start Time 1145   PT Stop Time 1225   PT Time Calculation (min) 40 min   Activity Tolerance Patient tolerated treatment well   Behavior During Therapy [facility] for tasks assessed/performed      History reviewed. No pertinent past medical history.  Past Surgical History:  Procedure Laterality Date  . CESAREAN SECTION      There were no vitals filed for this visit.      Subjective Assessment - 02/28/17 1211    Subjective Pateint arriving to therapy reporting 4/10 low back pain today which radiates down right LE.    Patient is accompained by: Interpreter   Limitations Standing;Walking;Lifting;House hold activities   How long can you stand comfortably? Standing for a long period of time and walking   How long can you walk comfortably? Medication    Diagnostic tests Lumbar MRI without contrast: No major deficits seen in the lumbar spine    Patient Stated Goals To have less pain    Currently in Pain? Yes   Pain Score 4    Pain Location Back   Pain Descriptors / Indicators Aching   Pain Type Chronic pain   Pain Onset More than a month ago   Pain Frequency Constant   Aggravating Factors  lifting, standing, walking   Pain Relieving Factors rest, pain medication            [facility] PT Assessment - 02/28/17 0001      Assessment   Medical Diagnosis Low back pain with right sided Sciatica    Referring Provider Dr. Rodell Perna   Hand Dominance Right   Prior Therapy none     Precautions   Precautions None      Restrictions   Weight Bearing Restrictions No     Balance Screen   Has the patient fallen in the past 6 months No   Has the patient had a decrease in activity level because of a fear of falling?  No   Is the patient reluctant to leave their home because of a fear of falling?  No                     [facility] Adult PT Treatment/Exercise - 02/28/17 0001      Exercises   Exercises Lumbar     Lumbar Exercises: Stretches   Active Hamstring Stretch Limitations seated hamstring stretch, 3 reps, holding 30 seconds each  bilateral LE's   Passive Hamstring Stretch Limitations seated hamstring stretch 3x20sec hold    Lower Trunk Rotation Limitations x10   Piriformis Stretch Limitations 3 reps holding 20 seconds each     Lumbar Exercises: Supine   Ab Set 5 reps;3 seconds   Clam 10 reps   Bridge 10 reps;5 seconds   Other Supine Lumbar Exercises supine marching 15 reps      Lumbar Exercises: Sidelying   Clam 15 reps;2 seconds     Modalities   Modalities Moist Heat     Moist Heat  Therapy   Number Minutes Moist Heat 10 Minutes   Moist Heat Location Lumbar Spine                PT Education - 02/28/17 1213    Education provided Yes   Education Details reviewed HEP   Person(s) Educated Patient;Other (comment)   Methods Explanation;Demonstration   Comprehension Verbalized understanding;Returned demonstration          PT Short Term Goals - 02/24/17 1113      PT SHORT TERM GOAL #1   Title Patient will be independent with HEP    Time 4   Period Weeks   Status New     PT SHORT TERM GOAL #2   Title Patient will increase bilateral hip strength to 4+/5    Time 4   Period Weeks   Status New     PT SHORT TERM GOAL #3   Title Patient will increase right hip internal rotation by 10 degrees    Baseline 5 degrees with pain    Time 4   Period Weeks   Status New           PT Long Term Goals - 02/28/17 1218      PT LONG TERM GOAL #1   Title Patient will  stand for 1/2 hour without a significant increase in pain    Period Weeks   Status New     PT LONG TERM GOAL #2   Title Patient will rotatte to the left and right without increased pain in order to perfrom ADL;s    Time 8   Period Weeks   Status New     PT LONG TERM GOAL #3   Title Patient will walk 1 mile without self report of pain   Period Weeks   Status New               Plan - 02/28/17 1242    Clinical Impression Statement Patient arriving today reporting 4/10 low back pain and pt with 2/10 back pain at end of session. Pt tolerating all lumbar exercises well. Pt reviewed HEP and was able to demonstrate correct techniques with some verbal cuing. Continue with skilled PT to progress pt toward goals set.    Rehab Potential Good   PT Frequency 2x / week   PT Duration 8 weeks   PT Treatment/Interventions ADLs/Self Care Home Management;Cryotherapy;Electrical Stimulation;Iontophoresis 4mg /ml Dexamethasone;Ultrasound;Gait training;Stair training;Functional mobility training;Patient/family education;Neuromuscular re-education;Therapeutic activities;Therapeutic exercise;Manual techniques;Passive range of motion;Dry needling;Taping   PT Next Visit Plan Assess tolerance to the-ex. Patient only given stretches 2nd to pain on the first visit. Review abdominal breathing, Consdier adding supine clams, supine march; posterior pelvi tilt; ball squeeze with abdominla brace; consider soft tissue mobilization if indicated.    PT Home Exercise Plan lateral trunk rotation; seated hamstring stretch; pirfromis stretch    Consulted and Agree with Plan of Care Patient      Patient will benefit from skilled therapeutic intervention in order to improve the following deficits and impairments:  Decreased range of motion, Difficulty walking, Decreased strength, Decreased activity tolerance, Increased muscle spasms, Pain  Visit Diagnosis: Chronic right-sided low back pain with right-sided  sciatica  Muscle spasm of back  Difficulty in walking, not elsewhere classified     Problem List Patient Active Problem List   Diagnosis Date Noted  . Pain in left foot 02/15/2017  . Low back pain without sciatica 02/15/2017  . Flatulence 09/11/2013  . GERD (gastroesophageal reflux disease) 09/11/2013  .  Pain due to dental caries 07/18/2013  . Acute pharyngitis 04/02/2013  . Sinusitis, acute 04/02/2013  . Streptococcal sore throat 04/02/2013  . Other malaise and fatigue 04/02/2013    Oretha Caprice, MPT 02/28/2017, 12:47 PM  [facility] [address] [place], [place], [address] Phone: [phone]   Fax:  [phone]  Name: Lauren Petersen MRN: [id number] Date of Birth: 1966/01/01

## 2017-03-01 ENCOUNTER — Ambulatory Visit: Payer: No Typology Code available for payment source | Admitting: Physical Therapy

## 2017-03-01 ENCOUNTER — Encounter: Payer: Self-pay | Admitting: Physical Therapy

## 2017-03-01 DIAGNOSIS — R262 Difficulty in walking, not elsewhere classified: Secondary | ICD-10-CM

## 2017-03-01 DIAGNOSIS — M5441 Lumbago with sciatica, right side: Principal | ICD-10-CM

## 2017-03-01 DIAGNOSIS — M62830 Muscle spasm of back: Secondary | ICD-10-CM

## 2017-03-01 DIAGNOSIS — G8929 Other chronic pain: Secondary | ICD-10-CM

## 2017-03-01 NOTE — Therapy (Signed)
[place], [place], [address] Phone: [phone]   Fax:  [phone]  Physical Therapy Treatment  Patient Details  Name: Lauren Petersen MRN: [id number] Date of Birth: 05/14/1966 Referring Provider: Dr. Rodell Perna  Encounter Date: 03/01/2017      PT End of Session - 03/01/17 1110    Visit Number 3   Number of Visits 16   Date for PT Re-Evaluation 04/21/17   Authorization Type CAFA 04/03/2017   PT Start Time 1100   PT Stop Time 1150   PT Time Calculation (min) 50 min   Activity Tolerance Patient tolerated treatment well   Behavior During Therapy [facility] for tasks assessed/performed      History reviewed. No pertinent past medical history.  Past Surgical History:  Procedure Laterality Date  . CESAREAN SECTION      There were no vitals filed for this visit.      Subjective Assessment - 03/01/17 1104    Subjective Patient arriving to therpay reporting 4/10 low back pain which radiates down her right LE. Pt reported feeling better yesteray after coming to therapy and doing the exercises.    Pertinent History chronic left goot pain    Limitations Standing;Walking;Lifting;House hold activities   How long can you stand comfortably? Standing for a long period of time and walking   How long can you walk comfortably? Medication    Diagnostic tests Lumbar MRI without contrast: No major deficits seen in the lumbar spine    Patient Stated Goals To have less pain    Currently in Pain? Yes   Pain Score 4    Pain Location Back   Pain Orientation Left   Pain Descriptors / Indicators Aching   Pain Type Chronic pain   Pain Onset More than a month ago   Pain Frequency Constant   Aggravating Factors  lifting, standing, walking   Pain Relieving Factors rest, pain medication   Effect of Pain on Daily Activities difficulty performing ADL's   Multiple Pain Sites No            [facility] PT Assessment - 03/01/17 0001       Assessment   Medical Diagnosis Low back pain with right sided Sciatica    Referring Provider Dr. Rodell Perna   Hand Dominance Right   Prior Therapy none     Precautions   Precautions None     Restrictions   Weight Bearing Restrictions No     Balance Screen   Has the patient fallen in the past 6 months No   Has the patient had a decrease in activity level because of a fear of falling?  No   Is the patient reluctant to leave their home because of a fear of falling?  No                     [facility] Adult PT Treatment/Exercise - 03/01/17 0001      Exercises   Exercises Lumbar     Lumbar Exercises: Stretches   Active Hamstring Stretch 3 reps   Active Hamstring Stretch Limitations supine hamstring stretch with green strap to assist  bilateral LE's   Passive Hamstring Stretch Limitations --   Single Knee to Chest Stretch 4 reps;10 seconds   Single Knee to Chest Stretch Limitations R LE   Lower Trunk Rotation Limitations 5 times each side   Piriformis Stretch Limitations 3 reps holding 20 seconds each  Lumbar Exercises: Aerobic   Stationary Bike Nustep: L1 x 8 minutes     Lumbar Exercises: Supine   Clam 10 reps   Clam Limitations red theraband x 2 sets   Bridge 10 reps;5 seconds   Other Supine Lumbar Exercises supine marching 15 reps    Other Supine Lumbar Exercises left hip distraction x 5 reps holding 30 seconds each.   Pt reported relief in R LE pain after distraction     Lumbar Exercises: Sidelying   Clam 15 reps;2 seconds     Modalities   Modalities Moist Heat     Moist Heat Therapy   Number Minutes Moist Heat 10 Minutes   Moist Heat Location Lumbar Spine                PT Education - 03/01/17 1106    Education Details Instructed in walking progression program beginning with a 5 minute continual walk and progressing to a 20 minute walk over the few weeks.    Person(s) Educated Patient   Methods Explanation   Comprehension Verbalized  understanding          PT Short Term Goals - 02/24/17 1113      PT SHORT TERM GOAL #1   Title Patient will be independent with HEP    Time 4   Period Weeks   Status New     PT SHORT TERM GOAL #2   Title Patient will increase bilateral hip strength to 4+/5    Time 4   Period Weeks   Status New     PT SHORT TERM GOAL #3   Title Patient will increase right hip internal rotation by 10 degrees    Baseline 5 degrees with pain    Time 4   Period Weeks   Status New           PT Long Term Goals - 03/01/17 1115      PT LONG TERM GOAL #1   Title Patient will stand for 1/2 hour without a significant increase in pain    Time 8   Period Weeks   Status New     PT LONG TERM GOAL #2   Title Patient will rotatte to the left and right without increased pain in order to perfrom ADL;s    Period Weeks   Status New     PT LONG TERM GOAL #3   Title Patient will walk 1 mile without self report of pain   Time 8   Period Weeks   Status New               Plan - 03/01/17 1110    Clinical Impression Statement Patient arriving to therapy today reporting 4/10 pain in her low back  and running down her left LE. Pt tolerated all exercises well. Pt began using the Nustep at L1 for 5 minutes and tolerated all supine/sidelying exericses on mat table. Also performed Left LE distraction which pt tolerated well and reported immediate refief in R LE pain. Continue with skilled PT to progress toward goals set.    History and Personal Factors relevant to plan of care: Patient prefers to be in a private room for mat exercises   Rehab Potential Good   PT Frequency 2x / week   PT Duration 8 weeks   PT Treatment/Interventions ADLs/Self Care Home Management;Cryotherapy;Electrical Stimulation;Iontophoresis 4mg /ml Dexamethasone;Ultrasound;Gait training;Stair training;Functional mobility training;Patient/family education;Neuromuscular re-education;Therapeutic activities;Therapeutic exercise;Manual  techniques;Passive range of motion;Dry needling;Taping   PT Next Visit Plan  continue with lumbar stretching and core strengthening exercises, R LE hip distraction, May try lumbar traction, STW and moist heat/modalities as needed.    PT Home Exercise Plan lateral trunk rotation; seated hamstring stretch; pirfromis stretch    Consulted and Agree with Plan of Care Patient      Patient will benefit from skilled therapeutic intervention in order to improve the following deficits and impairments:  Decreased range of motion, Difficulty walking, Decreased strength, Decreased activity tolerance, Increased muscle spasms, Pain  Visit Diagnosis: Chronic right-sided low back pain with right-sided sciatica  Muscle spasm of back  Difficulty in walking, not elsewhere classified     Problem List Patient Active Problem List   Diagnosis Date Noted  . Pain in left foot 02/15/2017  . Low back pain without sciatica 02/15/2017  . Flatulence 09/11/2013  . GERD (gastroesophageal reflux disease) 09/11/2013  . Pain due to dental caries 07/18/2013  . Acute pharyngitis 04/02/2013  . Sinusitis, acute 04/02/2013  . Streptococcal sore throat 04/02/2013  . Other malaise and fatigue 04/02/2013    Oretha Caprice , MPT 03/01/2017, 11:53 AM  [facility] [address] [place], [place], [address] Phone: [phone]   Fax:  [phone]  Name: KURSTEN KRUK MRN: [id number] Date of Birth: October 31, 1965

## 2017-03-07 ENCOUNTER — Ambulatory Visit
Admission: RE | Admit: 2017-03-07 | Discharge: 2017-03-07 | Disposition: A | Discharge status: Home / Self Care | Payer: No Typology Code available for payment source | Source: Ambulatory Visit | Attending: Family Medicine | Admitting: Family Medicine

## 2017-03-07 DIAGNOSIS — Z1231 Encounter for screening mammogram for malignant neoplasm of breast: Secondary | ICD-10-CM

## 2017-03-08 ENCOUNTER — Ambulatory Visit: Payer: No Typology Code available for payment source | Admitting: Physical Therapy

## 2017-03-10 ENCOUNTER — Encounter: Payer: Self-pay | Admitting: Physical Therapy

## 2017-03-10 ENCOUNTER — Ambulatory Visit: Payer: No Typology Code available for payment source | Attending: Orthopaedic Surgery | Admitting: Physical Therapy

## 2017-03-10 DIAGNOSIS — M5441 Lumbago with sciatica, right side: Secondary | ICD-10-CM | POA: Insufficient documentation

## 2017-03-10 DIAGNOSIS — R262 Difficulty in walking, not elsewhere classified: Secondary | ICD-10-CM | POA: Insufficient documentation

## 2017-03-10 DIAGNOSIS — G8929 Other chronic pain: Secondary | ICD-10-CM | POA: Insufficient documentation

## 2017-03-10 DIAGNOSIS — M62830 Muscle spasm of back: Secondary | ICD-10-CM | POA: Insufficient documentation

## 2017-03-10 NOTE — Therapy (Signed)
[place], [place], [address] Phone: [phone]   Fax:  [phone]  Physical Therapy Treatment  Patient Details  Name: Lauren Petersen MRN: [id number] Date of Birth: [birth year] Referring Provider: Dr. Rodell Perna  Encounter Date: 03/10/2017      PT End of Session - 03/10/17 0940    Visit Number 4   Date for PT Re-Evaluation 04/21/17   Authorization Type CAFA 04/03/2017   PT Start Time 0930   PT Stop Time 1023   PT Time Calculation (min) 53 min   Activity Tolerance Patient tolerated treatment well   Behavior During Therapy [facility] for tasks assessed/performed      History reviewed. No pertinent past medical history.  Past Surgical History:  Procedure Laterality Date  . CESAREAN SECTION      There were no vitals filed for this visit.      Subjective Assessment - 03/10/17 0937    Subjective Patient reports her pain is about a 4/10 today. She has increased pain when she stands for too long. Patient is having some pain going down her left leg. She has been doing her exercises    Patient is accompained by: Family member;Interpreter   Pertinent History chronic left goot pain    Limitations Standing;Walking;Lifting;House hold activities   How long can you stand comfortably? Standing for a long period of time and walking   How long can you walk comfortably? Medication    Diagnostic tests Lumbar MRI without contrast: No major deficits seen in the lumbar spine    Patient Stated Goals To have less pain    Currently in Pain? Yes   Pain Score 4    Pain Location Back   Pain Orientation Left   Pain Descriptors / Indicators Aching   Pain Type Chronic pain   Pain Onset More than a month ago   Pain Frequency Constant   Aggravating Factors  lifting, standing, walking    Pain Relieving Factors rest, pain medication    Effect of Pain on Daily Activities difficulty perfroming ADL's                           [facility] Adult PT Treatment/Exercise - 03/10/17 0001      Lumbar Exercises: Stretches   Active Hamstring Stretch Limitations supine hamstring stretch with green strap to assist  bilateral LE's   Lower Trunk Rotation Limitations x10 with cuing to keep hips flat    Piriformis Stretch Limitations 3 reps holding 20 seconds each     Lumbar Exercises: Aerobic   Stationary Bike Nustep: L1 x 5 minutes     Lumbar Exercises: Supine   Clam 10 reps   Clam Limitations red theraband x 2 sets   Bridge 10 reps;5 seconds   Other Supine Lumbar Exercises supine marching 15 reps    Other Supine Lumbar Exercises left hip distraction x 5 reps holding 30 seconds each.   Pt reported relief in R LE pain after distraction     Lumbar Exercises: Sidelying   Clam 15 reps;2 seconds     Modalities   Modalities Moist Heat     Moist Heat Therapy   Number Minutes Moist Heat 10 Minutes   Moist Heat Location Lumbar Spine     Manual Therapy   Manual therapy comments LAD of the left hip; PAssive ER and IR of the hip to improve movement  PT Education - 03/10/17 [phone]    Education provided Yes   Education Details Continue with walking/ stretching and exercises    Person(s) Educated Patient   Methods Explanation   Comprehension Verbalized understanding          PT Short Term Goals - 03/10/17 1224      PT SHORT TERM GOAL #1   Title Patient will be independent with HEP    Baseline performing at home    Time 4   Period Weeks   Status Achieved     PT SHORT TERM GOAL #2   Title Patient will increase bilateral hip strength to 4+/5    Time 4   Period Weeks     PT SHORT TERM GOAL #3   Title Patient will increase right hip internal rotation by 10 degrees    Baseline 5 degrees with pain    Time 4   Period Weeks   Status New           PT Long Term Goals - 03/01/17 1115      PT LONG TERM GOAL #1   Title Patient will stand for 1/2 hour  without a significant increase in pain    Time 8   Period Weeks   Status New     PT LONG TERM GOAL #2   Title Patient will rotatte to the left and right without increased pain in order to perfrom ADL;s    Period Weeks   Status New     PT LONG TERM GOAL #3   Title Patient will walk 1 mile without self report of pain   Time 8   Period Weeks   Status New               Plan - 03/10/17 1215    Clinical Impression Statement Patient is making good progress. Her main problem at this time is difficulty standing for ong periods of time. She was adivsed to improve this she will have to continue working on strengthening. She understands and will. Continue to progress core strengthening.    Clinical Presentation Stable   Clinical Decision Making Low   Rehab Potential Good   PT Frequency 2x / week   PT Duration 8 weeks   PT Treatment/Interventions Moist Heat   PT Next Visit Plan continue with lumbar stretching and core strengthening exercises, R LE hip distraction, May try lumbar traction, STW and moist heat/modalities as needed.    PT Home Exercise Plan lateral trunk rotation; seated hamstring stretch; pirfromis stretch       Patient will benefit from skilled therapeutic intervention in order to improve the following deficits and impairments:  Decreased range of motion, Difficulty walking, Decreased strength, Decreased activity tolerance, Increased muscle spasms, Pain  Visit Diagnosis: Chronic right-sided low back pain with right-sided sciatica  Muscle spasm of back  Difficulty in walking, not elsewhere classified     Problem List Patient Active Problem List   Diagnosis Date Noted  . Pain in left foot 02/15/2017  . Low back pain without sciatica 02/15/2017  . Flatulence 09/11/2013  . GERD (gastroesophageal reflux disease) 09/11/2013  . Pain due to dental caries 07/18/2013  . Acute pharyngitis 04/02/2013  . Sinusitis, acute 04/02/2013  . Streptococcal sore throat  04/02/2013  . Other malaise and fatigue 04/02/2013    Carney Living 03/10/2017, 12:27 PM  [facility] [address] [place], [place], [address] Phone: [phone]   Fax:  [phone]  Name: JEWELZ RICKLEFS MRN: [id number] Date of Birth: 1966-01-10

## 2017-03-14 ENCOUNTER — Ambulatory Visit: Payer: No Typology Code available for payment source | Admitting: Physical Therapy

## 2017-03-14 ENCOUNTER — Encounter: Payer: Self-pay | Admitting: Physical Therapy

## 2017-03-14 DIAGNOSIS — G8929 Other chronic pain: Secondary | ICD-10-CM

## 2017-03-14 DIAGNOSIS — R262 Difficulty in walking, not elsewhere classified: Secondary | ICD-10-CM

## 2017-03-14 DIAGNOSIS — M5441 Lumbago with sciatica, right side: Principal | ICD-10-CM

## 2017-03-14 DIAGNOSIS — M62830 Muscle spasm of back: Secondary | ICD-10-CM

## 2017-03-14 NOTE — Therapy (Addendum)
[place], [place], [address] Phone: [phone]   Fax:  [phone]  Physical Therapy Treatment  Patient Details  Name: Lauren Petersen MRN: [id number] Date of Birth: January 08, 1966 Referring Provider: Dr. Rodell Perna  Encounter Date: 03/14/2017      PT End of Session - 03/14/17 1129    Visit Number 5   Number of Visits 16   Date for PT Re-Evaluation 04/21/17   Authorization Type CAFA 04/03/2017   PT Start Time 1103   PT Stop Time 1155   PT Time Calculation (min) 52 min   Activity Tolerance Patient tolerated treatment well   Behavior During Therapy [facility] for tasks assessed/performed      History reviewed. No pertinent past medical history.  Past Surgical History:  Procedure Laterality Date  . CESAREAN SECTION      There were no vitals filed for this visit.      Subjective Assessment - 03/14/17 1108    Subjective Patient reports her back has improved. It is [place] so she has a lot of family in town and she has been standing to cook. She feels like it is sore when she stands for too long.    Pertinent History chronic left foot pain    Limitations Standing;Walking;Lifting;House hold activities   How long can you stand comfortably? Standing for a long period of time and walking   How long can you walk comfortably? Medication    Diagnostic tests Lumbar MRI without contrast: No major deficits seen in the lumbar spine    Patient Stated Goals To have less pain    Currently in Pain? Yes   Pain Score 3    Pain Location Back   Pain Orientation Left   Pain Descriptors / Indicators Aching   Pain Type Chronic pain   Pain Onset More than a month ago   Pain Frequency Constant   Aggravating Factors  liftin, standing, walking    Pain Relieving Factors rest, pain medication    Effect of Pain on Daily Activities difficulty perfroming ADL's                          [facility] Adult PT  Treatment/Exercise - 03/14/17 0001      Lumbar Exercises: Stretches   Active Hamstring Stretch Limitations supine hamstring stretch with green strap to assist  bilateral LE's   Lower Trunk Rotation Limitations x10 with cuing to keep hips flat    Piriformis Stretch Limitations 3 reps holding 20 seconds each     Lumbar Exercises: Aerobic   Stationary Bike Nustep: L1 x 5 minutes     Lumbar Exercises: Supine   Clam --   Clam Limitations 2x10 red    Bridge 10 reps;5 seconds   Straight Leg Raises Limitations 1x10 each leg    Other Supine Lumbar Exercises supine marching 15 reps      Moist Heat Therapy   Number Minutes Moist Heat 10 Minutes   Moist Heat Location Lumbar Spine     Manual Therapy   Manual therapy comments LAD of the left hip; PAssive ER and IR of the hip to improve movement                 PT Education - 03/14/17 1113    Education provided Yes   Education Details continue with exercises.    Person(s) Educated Patient   Methods Explanation   Comprehension Verbalized understanding  PT Short Term Goals - 03/14/17 1132      PT SHORT TERM GOAL #1   Title Patient will be independent with HEP    Baseline performing at home    Time 4   Period Weeks   Status Achieved     PT SHORT TERM GOAL #2   Title Patient will increase bilateral hip strength to 4+/5    Baseline not measured   Time 4   Period Weeks   Status On-going     PT SHORT TERM GOAL #3   Title Patient will increase right hip internal rotation by 10 degrees    Baseline improving hip rotation but not measured    Time 4   Period Weeks   Status On-going           PT Long Term Goals - 03/01/17 1115      PT LONG TERM GOAL #1   Title Patient will stand for 1/2 hour without a significant increase in pain    Time 8   Period Weeks   Status New     PT LONG TERM GOAL #2   Title Patient will rotatte to the left and right without increased pain in order to perfrom ADL;s    Period  Weeks   Status New     PT LONG TERM GOAL #3   Title Patient will walk 1 mile without self report of pain   Time 8   Period Weeks   Status New               Plan - 03/14/17 1130    Clinical Impression Statement Patient encouraged to continue working on stregthening exercises. Her C/O is standing for long periods of time. Exercises will help mprove that.    History and Personal Factors relevant to plan of care: private room for exercises.    Rehab Potential Good   PT Frequency 2x / week   PT Duration 8 weeks   PT Treatment/Interventions Moist Heat   PT Next Visit Plan continue with lumbar stretching and core strengthening exercises, R LE hip distraction, May try lumbar traction, STW and moist heat/modalities as needed.    PT Home Exercise Plan lateral trunk rotation; seated hamstring stretch; pirfromis stretch    Consulted and Agree with Plan of Care Patient      Patient will benefit from skilled therapeutic intervention in order to improve the following deficits and impairments:  Decreased range of motion, Difficulty walking, Decreased strength, Decreased activity tolerance, Increased muscle spasms, Pain  Visit Diagnosis: Chronic right-sided low back pain with right-sided sciatica  Muscle spasm of back  Difficulty in walking, not elsewhere classified     Problem List Patient Active Problem List   Diagnosis Date Noted  . Pain in left foot 02/15/2017  . Low back pain without sciatica 02/15/2017  . Flatulence 09/11/2013  . GERD (gastroesophageal reflux disease) 09/11/2013  . Pain due to dental caries 07/18/2013  . Acute pharyngitis 04/02/2013  . Sinusitis, acute 04/02/2013  . Streptococcal sore throat 04/02/2013  . Other malaise and fatigue 04/02/2013    Carney Living PT DPT  03/14/2017, 11:48 AM  [facility] [address] [place], [place], [address] Phone: [phone]   Fax:  [phone]  Name: Lauren Petersen MRN: [id number] Date of Birth: 1966-09-24

## 2017-03-16 ENCOUNTER — Ambulatory Visit: Payer: No Typology Code available for payment source | Admitting: Physical Therapy

## 2017-03-16 DIAGNOSIS — R262 Difficulty in walking, not elsewhere classified: Secondary | ICD-10-CM

## 2017-03-16 DIAGNOSIS — G8929 Other chronic pain: Secondary | ICD-10-CM

## 2017-03-16 DIAGNOSIS — M5441 Lumbago with sciatica, right side: Principal | ICD-10-CM

## 2017-03-16 DIAGNOSIS — M62830 Muscle spasm of back: Secondary | ICD-10-CM

## 2017-03-16 NOTE — Therapy (Signed)
[place], [place], [address] Phone: [phone]   Fax:  [phone]  Physical Therapy Treatment / Discharge   Patient Details  Name: Lauren Petersen MRN: [id number] Date of Birth: October 05, 1965 Referring Provider: Dr. Rodell Perna  Encounter Date: 03/16/2017      PT End of Session - 03/16/17 1104    Visit Number 6   Number of Visits 16   Date for PT Re-Evaluation 04/21/17   Authorization Type CAFA 04/03/2017   PT Start Time 1100   PT Stop Time 1154   PT Time Calculation (min) 54 min   Activity Tolerance Patient tolerated treatment well   Behavior During Therapy [facility] for tasks assessed/performed      No past medical history on file.  Past Surgical History:  Procedure Laterality Date  . CESAREAN SECTION      There were no vitals filed for this visit.      Subjective Assessment - 03/16/17 1104    Subjective Patient reports she feels like her pain is better. She has 3/10 pain today. She will be going out of the country for 50 days. She feels confortable with her exercises today. She will D/C to HEP today.    Pertinent History chronic left foot pain    Limitations Standing;Walking;Lifting;House hold activities   How long can you stand comfortably? Standing for a long period of time and walking   How long can you walk comfortably? Medication    Diagnostic tests Lumbar MRI without contrast: No major deficits seen in the lumbar spine    Patient Stated Goals To have less pain    Currently in Pain? Yes   Pain Score 3    Pain Location Back   Pain Orientation Left   Pain Descriptors / Indicators Aching   Pain Type Chronic pain   Pain Onset More than a month ago   Pain Frequency Constant   Aggravating Factors  lifting, standing, walking    Pain Relieving Factors rest, pain medication    Effect of Pain on Daily Activities difficulty perfroming ADL's    Multiple Pain Sites No            [facility] PT  Assessment - 03/16/17 0001      AROM   Lumbar Flexion No limit but minor pain    Lumbar Extension No limit    Lumbar - Right Side Bend No limit    Lumbar - Left Side Bend no limit    Lumbar - Right Rotation no limit      PROM   Overall PROM Comments improvement in hip IR on the left to 18 degrees                      [facility] Adult PT Treatment/Exercise - 03/16/17 0001      Lumbar Exercises: Stretches   Active Hamstring Stretch Limitations supine hamstring stretch with green strap to assist  bilateral LE's   Lower Trunk Rotation Limitations x10 with cuing to keep hips flat    Piriformis Stretch Limitations 3 reps holding 20 seconds each     Lumbar Exercises: Aerobic   Stationary Bike Nustep: L1 x 5 minutes     Lumbar Exercises: Supine   Clam Limitations 2x10 red    Bridge 10 reps;5 seconds   Straight Leg Raises Limitations 1x10 each leg    Other Supine Lumbar Exercises supine marching 15 reps      Moist Heat Therapy  Moist Heat Location Lumbar Spine     Manual Therapy   Manual therapy comments LAD of the left hip; PAssive ER and IR of the hip to improve movement                 PT Education - 03/16/17 1116    Education provided Yes   Education Details reviewed HEP, enocuraged patient to continue with exercises even when she is on her trip.    Person(s) Educated Patient   Methods Explanation;Demonstration   Comprehension Verbalized understanding;Returned demonstration          PT Short Term Goals - 03/16/17 1119      PT SHORT TERM GOAL #1   Title Patient will be independent with HEP    Baseline performing at home    Time 4   Period Weeks   Status Achieved     PT SHORT TERM GOAL #2   Title Patient will increase bilateral hip strength to 4+/5    Time 4   Period Weeks     PT SHORT TERM GOAL #3   Title Patient will increase right hip internal rotation by 10 degrees    Baseline improved by 14 degrees    Time 4   Period Weeks   Status  Achieved           PT Long Term Goals - 03/16/17 1120      PT LONG TERM GOAL #1   Title Patient will stand for 1/2 hour without a significant increase in pain    Baseline Around a half an hour is when she begins to have pain.    Time 8   Period Weeks   Status Achieved     PT LONG TERM GOAL #2   Title Patient will rotatte to the left and right without increased pain in order to perfrom ADL;s    Baseline improved ability to rotate    Time 8   Period Weeks   Status Achieved     PT LONG TERM GOAL #3   Title Patient will walk 1 mile without self report of pain   Baseline can walk about a mile    Time 8   Period Weeks   Status On-going               Plan - 03/16/17 1117    Clinical Impression Statement Patient has made good progress. She continues to have some pain but she is making steady improvments. She will be going out of the country for 2 months. She was encouraged to continue with exercises.    Clinical Presentation Stable   Clinical Decision Making Low   Rehab Potential Good   PT Frequency 2x / week   PT Duration 8 weeks   PT Treatment/Interventions Moist Heat;ADLs/Self Care Home Management;Electrical Stimulation;Iontophoresis 83m/ml Dexamethasone;Gait training;Stair training;Patient/family education;Therapeutic activities;Therapeutic exercise;Manual techniques   PT Next Visit Plan D/C to current HEP    PT Home Exercise Plan lateral trunk rotation; seated hamstring stretch; pirfromis stretch    Consulted and Agree with Plan of Care Patient      Patient will benefit from skilled therapeutic intervention in order to improve the following deficits and impairments:  Decreased range of motion, Difficulty walking, Decreased strength, Decreased activity tolerance, Increased muscle spasms, Pain  Visit Diagnosis: Chronic right-sided low back pain with right-sided sciatica  Muscle spasm of back  Difficulty in walking, not elsewhere classified  PHYSICAL THERAPY  DISCHARGE SUMMARY  Visits from Start of Care: 6  Current functional level related to goals / functional outcomes: Improved pain with functional activity    Remaining deficits: Pain when standing for long periods of time    Education / Equipment: HEP  Plan: Patient agrees to discharge.  Patient goals were met. Patient is being discharged due to being pleased with the current functional level.  ?????       Problem List Patient Active Problem List   Diagnosis Date Noted  . Pain in left foot 02/15/2017  . Low back pain without sciatica 02/15/2017  . Flatulence 09/11/2013  . GERD (gastroesophageal reflux disease) 09/11/2013  . Pain due to dental caries 07/18/2013  . Acute pharyngitis 04/02/2013  . Sinusitis, acute 04/02/2013  . Streptococcal sore throat 04/02/2013  . Other malaise and fatigue 04/02/2013    Carney Living PT DPT  03/16/2017, 11:49 AM  [facility] [address] [place], [place], [address] Phone: [phone]   Fax:  [phone]  Name: DARIANNE MURALLES MRN: [id number] Date of Birth: 1966/01/28

## 2017-03-20 ENCOUNTER — Ambulatory Visit: Payer: No Typology Code available for payment source | Admitting: Physical Therapy

## 2017-03-31 MED FILL — HYDROCHLOROTHIAZIDE 25 MG T: 25 | 30 days supply | Qty: 30 | Fill #1

## 2017-05-19 ENCOUNTER — Ambulatory Visit: Payer: Self-pay | Attending: Family Medicine

## 2017-08-21 ENCOUNTER — Ambulatory Visit: Payer: Self-pay | Attending: Family Medicine

## 2017-11-20 ENCOUNTER — Ambulatory Visit: Payer: Self-pay | Attending: Family Medicine

## 2017-12-15 IMAGING — MG DIGITAL SCREENING BILATERAL MAMMOGRAM WITH CAD
4 series · 4 of 4 positions shown · non-contrast
Comparison: Previous exam(s).

CLINICAL DATA: Screening.

EXAM:
DIGITAL SCREENING BILATERAL MAMMOGRAM WITH CAD

[L MLO]
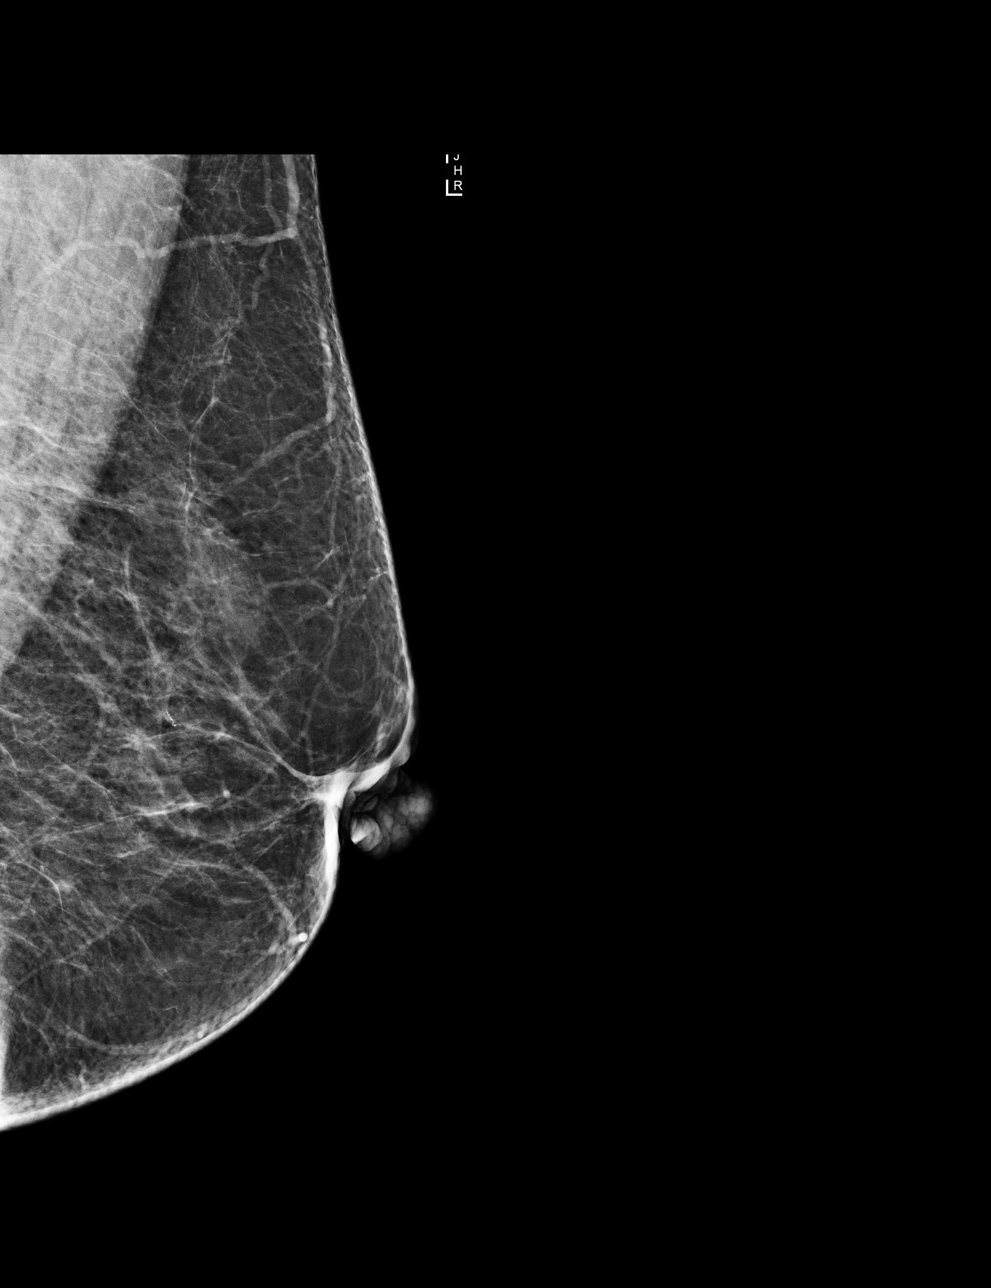

[R CC]
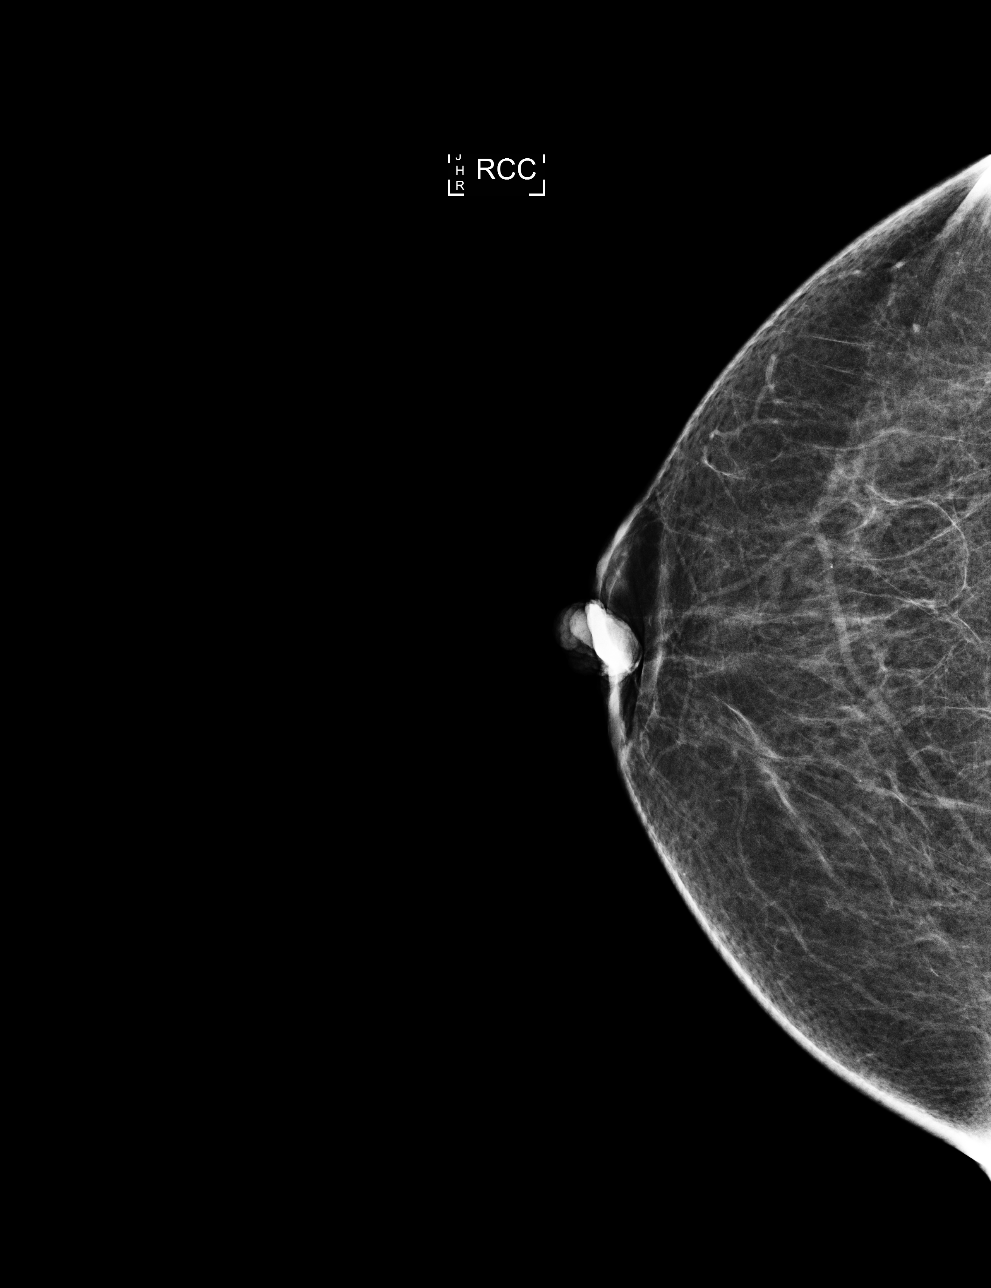

[L CC]
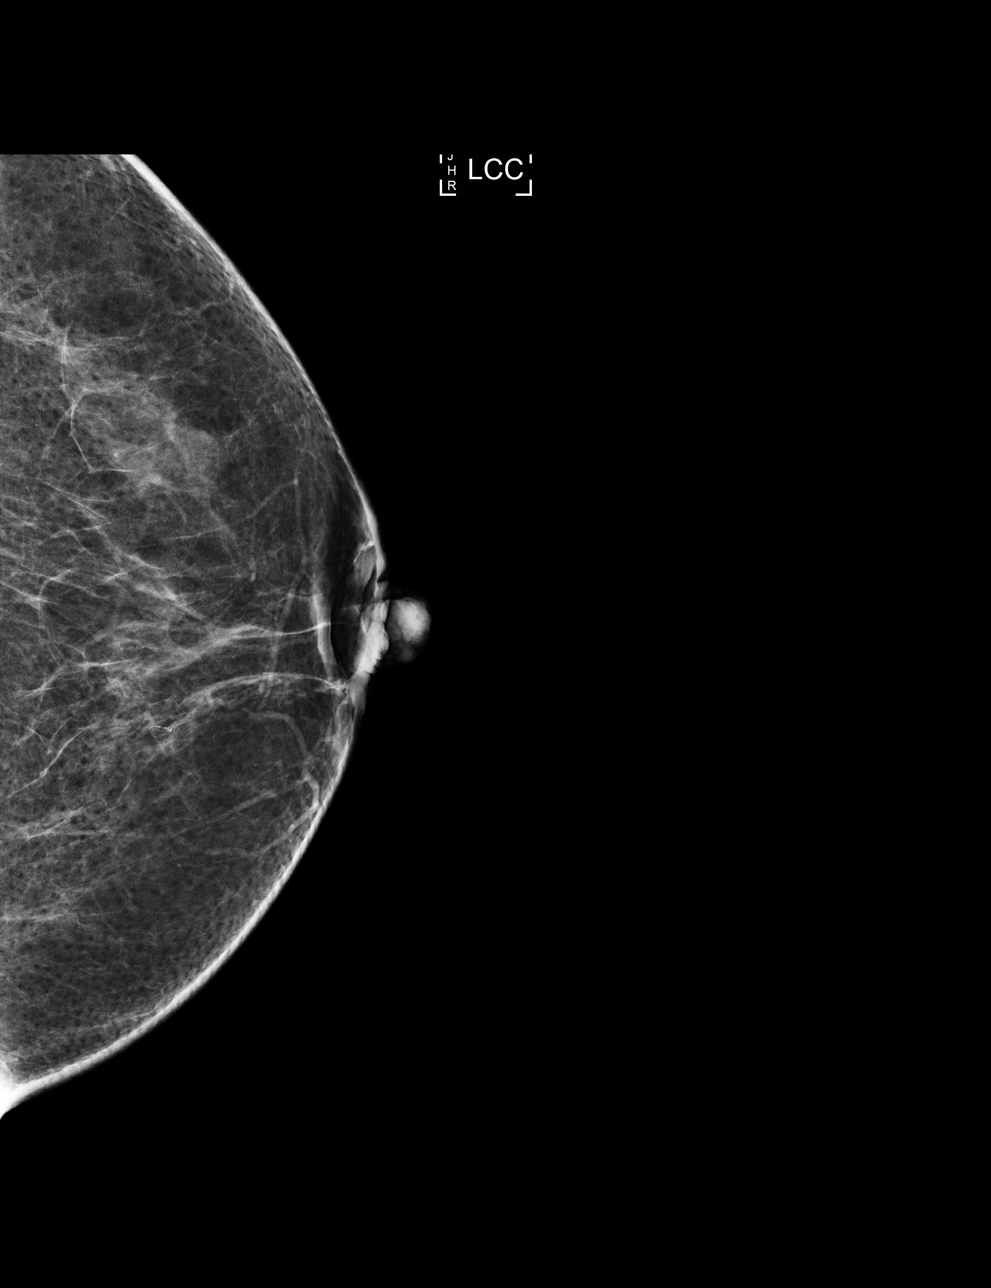

[R MLO]
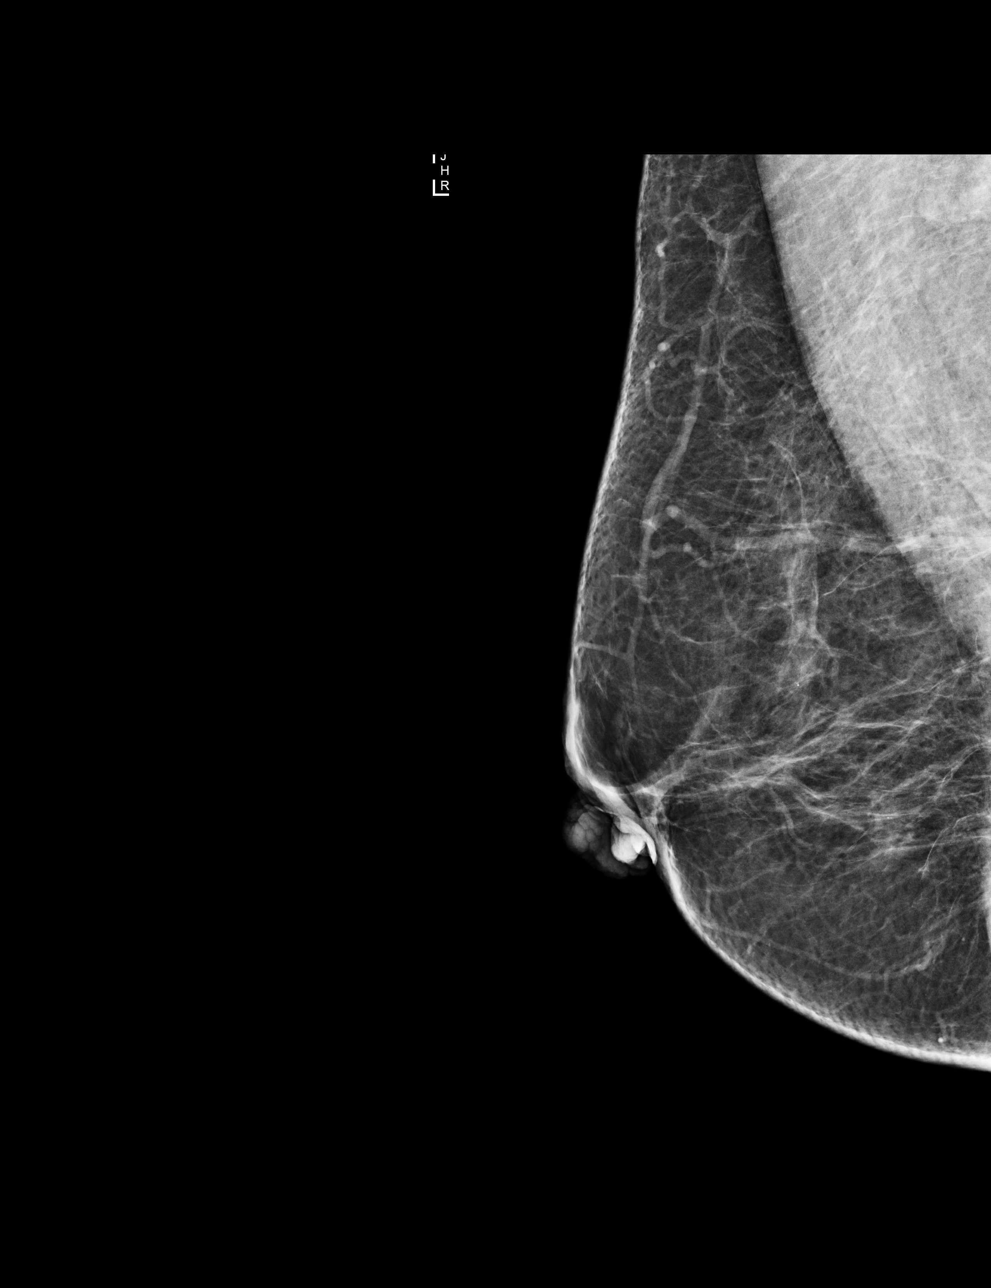

[4 of 4 positions shown; findings below may reference images not displayed]

ACR Breast Density Category b: There are scattered areas of
fibroglandular density.
FINDINGS: There are no findings suspicious for malignancy. Images were
processed with CAD.
IMPRESSION: No mammographic evidence of malignancy. A result letter of this
screening mammogram will be mailed directly to the patient.

RECOMMENDATION:
Screening mammogram in one year. (Code:AS-G-LCT)

BI-RADS CATEGORY  1: Negative.

## 2018-02-22 ENCOUNTER — Other Ambulatory Visit: Payer: Self-pay | Admitting: Family Medicine

## 2018-02-22 DIAGNOSIS — Z1231 Encounter for screening mammogram for malignant neoplasm of breast: Secondary | ICD-10-CM

## 2018-05-23 ENCOUNTER — Ambulatory Visit: Payer: Self-pay | Attending: Family Medicine

## 2018-09-15 DIAGNOSIS — H40033 Anatomical narrow angle, bilateral: Secondary | ICD-10-CM | POA: Diagnosis not present

## 2018-09-15 DIAGNOSIS — H16223 Keratoconjunctivitis sicca, not specified as Sjogren's, bilateral: Secondary | ICD-10-CM | POA: Diagnosis not present

## 2018-09-16 DIAGNOSIS — H5213 Myopia, bilateral: Secondary | ICD-10-CM | POA: Diagnosis not present

## 2018-09-21 ENCOUNTER — Other Ambulatory Visit: Payer: Self-pay | Admitting: Family Medicine

## 2018-09-21 ENCOUNTER — Ambulatory Visit (INDEPENDENT_AMBULATORY_CARE_PROVIDER_SITE_OTHER): Payer: Medicaid Other | Admitting: Family Medicine

## 2018-09-21 ENCOUNTER — Encounter: Payer: Self-pay | Admitting: Family Medicine

## 2018-09-21 VITALS — BP 151/68 | HR 73 | Temp 98.0°F | Resp 16 | Ht 58.5 in | Wt 140.0 lb

## 2018-09-21 DIAGNOSIS — R143 Flatulence: Secondary | ICD-10-CM | POA: Diagnosis not present

## 2018-09-21 DIAGNOSIS — Z1231 Encounter for screening mammogram for malignant neoplasm of breast: Secondary | ICD-10-CM

## 2018-09-21 DIAGNOSIS — E559 Vitamin D deficiency, unspecified: Secondary | ICD-10-CM | POA: Diagnosis not present

## 2018-09-21 DIAGNOSIS — E663 Overweight: Secondary | ICD-10-CM | POA: Diagnosis not present

## 2018-09-21 DIAGNOSIS — Z789 Other specified health status: Secondary | ICD-10-CM | POA: Diagnosis not present

## 2018-09-21 DIAGNOSIS — Z23 Encounter for immunization: Secondary | ICD-10-CM | POA: Diagnosis not present

## 2018-09-21 DIAGNOSIS — M5442 Lumbago with sciatica, left side: Secondary | ICD-10-CM | POA: Diagnosis not present

## 2018-09-21 DIAGNOSIS — G8929 Other chronic pain: Secondary | ICD-10-CM | POA: Diagnosis not present

## 2018-09-21 DIAGNOSIS — I10 Essential (primary) hypertension: Secondary | ICD-10-CM | POA: Diagnosis not present

## 2018-09-21 DIAGNOSIS — Z1211 Encounter for screening for malignant neoplasm of colon: Secondary | ICD-10-CM

## 2018-09-21 LAB — POCT URINALYSIS DIPSTICK
Bilirubin, UA: NEGATIVE
Blood, UA: NEGATIVE
Glucose, UA: NEGATIVE
Ketones, UA: NEGATIVE
Leukocytes, UA: NEGATIVE
Nitrite, UA: NEGATIVE
Protein, UA: NEGATIVE
Spec Grav, UA: 1.025 (ref 1.010–1.025)
Urobilinogen, UA: 0.2 E.U./dL
pH, UA: 5 (ref 5.0–8.0)

## 2018-09-21 MED ORDER — HYDROCHLOROTHIAZIDE 25 MG PO TABS: 25.0000 mg | ORAL_TABLET | Freq: Every day | ORAL | 3 refills | Status: DC

## 2018-09-21 MED ORDER — NAPROXEN 500 MG PO TABS: 500.0000 mg | ORAL_TABLET | Freq: Two times a day (BID) | ORAL | 2 refills | Status: AC

## 2018-09-21 MED ORDER — SIMETHICONE 80 MG PO CHEW: 80.0000 mg | CHEWABLE_TABLET | Freq: Four times a day (QID) | ORAL | 3 refills | Status: DC | PRN

## 2018-09-21 NOTE — Progress Notes (Signed)
Patient [facility]  New Patient Encounter Provider: Lanae Boast, [place]  FUX:[id number]  DOB - Aug 29, 1966  SUBJECTIVE:   Lauren Petersen, is a 52 y.o. female who presents to establish care with this clinic.   Current problems/concerns:   Patient reports not having medication over the past 6 months.  She states that she had been taking hydrochlorothiazide 25 mg without any side effects.  Would like to restart this medication today. Patient also states that she has left-sided back pain with numbness and tingling down the left leg.  She states that this is been going on for some time and that the pain is intermittent.  No alleviating factors noted.  Aggravated by bending moving and positional changes. Patient also states that she has an increased amount of gas.  She has been on simethicone in the past with relief.  Patient denies reflux, belching heartburn at the present time.  No Known Allergies History reviewed. No pertinent past medical history. Current Outpatient Medications on File Prior to Visit  Medication Sig Dispense Refill  . norethindrone (MICRONOR,CAMILA,ERRIN) 0.35 MG tablet Take 1 tablet by mouth daily.    Danelle Earthly Estradiol (VYFEMLA PO) Take 1 tablet by mouth daily.    Marland Kitchen senna-docusate (SENOKOT S) 8.6-50 MG per tablet Take 1 tablet by mouth at bedtime as needed for mild constipation. (Patient not taking: Reported on 09/19/2016) 30 tablet 2  . [DISCONTINUED] ranitidine (ZANTAC) 150 MG tablet Take 150 mg by mouth 2 (two) times daily.     No current facility-administered medications on file prior to visit.    History reviewed. No pertinent family history. Social History   Socioeconomic History  . Marital status: Married    Spouse name: Not on file  . Number of children: Not on file  . Years of education: Not on file  . Highest education level: Not on file  Occupational History  . Not on file  Social  Needs  . Financial resource strain: Not on file  . Food insecurity:    Worry: Not on file    Inability: Not on file  . Transportation needs:    Medical: Not on file    Non-medical: Not on file  Tobacco Use  . Smoking status: Never Smoker  . Smokeless tobacco: Never Used  Substance and Sexual Activity  . Alcohol use: No  . Drug use: No  . Sexual activity: Yes    Birth control/protection: I.U.D.  Lifestyle  . Physical activity:    Days per week: Not on file    Minutes per session: Not on file  . Stress: Not on file  Relationships  . Social connections:    Talks on phone: Not on file    Gets together: Not on file    Attends religious service: Not on file    Active member of club or organization: Not on file    Attends meetings of clubs or organizations: Not on file    Relationship status: Not on file  . Intimate partner violence:    Fear of current or ex partner: Not on file    Emotionally abused: Not on file    Physically abused: Not on file    Forced sexual activity: Not on file  Other Topics Concern  . Not on file  Social History Narrative  . Not on file    Review of Systems  Constitutional: Negative.   HENT: Negative.   Eyes: Negative.  Respiratory: Negative.   Cardiovascular: Negative.   Gastrointestinal: Negative.  Negative for heartburn.       Increased flatulence  Genitourinary: Negative.   Musculoskeletal: Positive for back pain.  Skin: Negative.   Neurological: Negative.   Psychiatric/Behavioral: Negative.      OBJECTIVE:    BP (!) 151/68 (BP Location: Left Arm, Patient Position: Sitting, Cuff Size: Normal)   Pulse 73   Temp 98 F (36.7 C) (Oral)   Resp 16   Ht 4' 10.5" (1.486 m)   Wt 140 lb (63.5 kg)   LMP 08/22/2018   SpO2 100%   BMI 28.76 kg/m   Physical Exam  Constitutional: She is oriented to person, place, and time and well-developed, well-nourished, and in no distress. No distress.  HENT:  Head: Normocephalic and atraumatic.    Eyes: Pupils are equal, round, and reactive to light. Conjunctivae and EOM are normal.  Neck: Normal range of motion. Neck supple.  Cardiovascular: Normal rate, regular rhythm and intact distal pulses. Exam reveals no gallop and no friction rub.  No murmur heard. Pulmonary/Chest: Effort normal and breath sounds normal. No respiratory distress. She has no wheezes.  Abdominal: Soft. Bowel sounds are normal. There is no abdominal tenderness.  Musculoskeletal: Normal range of motion.        General: No tenderness or edema.     Comments: No tenderness or decrease in range of motion noted at the present time.  No muscle spasms or deformities noted. No swelling noted to the bilateral lower extremities at the present time  Lymphadenopathy:    She has no cervical adenopathy.  Neurological: She is alert and oriented to person, place, and time. Gait normal.  Skin: Skin is warm and dry.  Psychiatric: Mood, memory, affect and judgment normal.  Nursing note and vitals reviewed.    ASSESSMENT/PLAN:  1. Essential hypertension Restart hydrochlorothiazide 25 mg daily.  Labs ordered today. - Urinalysis Dipstick - Basic Metabolic Panel - Microalbumin/Creatinine Ratio, Urine - CBC with Differential - hydrochlorothiazide (HYDRODIURIL) 25 MG tablet; Take 1 tablet (25 mg total) by mouth daily.  Dispense: 90 tablet; Refill: 3  2. Screen for colon cancer Health maintenance ordered. - Ambulatory referral to Gastroenterology  3. Chronic left-sided low back pain with left-sided sciatica Instructed patient to take NSAIDs twice a day for the next 10 days.  Can use as needed for pain after that. - naproxen (NAPROSYN) 500 MG tablet; Take 1 tablet (500 mg total) by mouth 2 (two) times daily with a meal.  Dispense: 60 tablet; Refill: 2  4. Overweight Discussed healthy eating and exercise for weight loss. - TSH  5. Vitamin D deficiency Patient not taking vitamin D supplement.  Will check today and reorder if  necessary. - Vitamin D, 25-hydroxy  6. Flatulence Simethicone sent to the pharmacy on file.  Discussed using diet to manage this issue. - simethicone (GAS-X) 80 MG chewable tablet; Chew 1 tablet (80 mg total) by mouth every 6 (six) hours as needed for flatulence (take one tab at bed time everynight).  Dispense: 60 tablet; Refill: 3  7. Language barrier to communication Arabic interpreter used throughout the encounter.  Influenza vaccination given in the office visit today.   Return in about 2 weeks (around 10/05/2018) for HTN and fasting labs.  The patient was given clear instructions to go to ER or return to medical center if symptoms don't improve, worsen or new problems develop. The patient verbalized understanding. The patient was told to call to get  lab results if they haven't heard anything in the next week.     This note has been created with Surveyor, quantity. Any transcriptional errors are unintentional.   Ms. Andr L. Nathaneil Canary, FNP-BC Patient [facility] [address] [place], [place] [address] [phone]

## 2018-09-21 NOTE — Patient Instructions (Addendum)
You have sciatica. I am sending a medication ( Naproxen) for you to take twice a day for the next 10 days. After that, you can take it as needed for pain. Take this medication with food.   Sciatica  Sciatica is pain, numbness, weakness, or tingling along your sciatic nerve. The sciatic nerve starts in the lower back and goes down the back of each leg. Sciatica happens when this nerve is pinched or has pressure put on it. Sciatica usually goes away on its own or with treatment. Sometimes, sciatica may keep coming back (recur). Follow these instructions at home: Medicines  Take over-the-counter and prescription medicines only as told by your doctor.  Do not drive or use heavy machinery while taking prescription pain medicine. Managing pain  If directed, put ice on the affected area. ? Put ice in a plastic bag. ? Place a towel between your skin and the bag. ? Leave the ice on for 20 minutes, 2-3 times a day.  After icing, apply heat to the affected area before you exercise or as often as told by your doctor. Use the heat source that your doctor tells you to use, such as a moist heat pack or a heating pad. ? Place a towel between your skin and the heat source. ? Leave the heat on for 20-30 minutes. ? Remove the heat if your skin turns bright red. This is especially important if you are unable to feel pain, heat, or cold. You may have a greater risk of getting burned. Activity  Return to your normal activities as told by your doctor. Ask your doctor what activities are safe for you. ? Avoid activities that make your sciatica worse.  Take short rests during the day. Rest in a lying or standing position. This is usually better than sitting to rest. ? When you rest for a long time, do some physical activity or stretching between periods of rest. ? Avoid sitting for a long time without moving. Get up and move around at least one time each hour.  Exercise and stretch regularly, as told by your  doctor.  Do not lift anything that is heavier than 10 lb (4.5 kg) while you have symptoms of sciatica. ? Avoid lifting heavy things even when you do not have symptoms. ? Avoid lifting heavy things over and over.  When you lift objects, always lift in a way that is safe for your body. To do this, you should: ? Bend your knees. ? Keep the object close to your body. ? Avoid twisting. General instructions  Use good posture. ? Avoid leaning forward when you are sitting. ? Avoid hunching over when you are standing.  Stay at a healthy weight.  Wear comfortable shoes that support your feet. Avoid wearing high heels.  Avoid sleeping on a mattress that is too soft or too hard. You might have less pain if you sleep on a mattress that is firm enough to support your back.  Keep all follow-up visits as told by your doctor. This is important. Contact a doctor if:  You have pain that: ? Wakes you up when you are sleeping. ? Gets worse when you lie down. ? Is worse than the pain you have had in the past. ? Lasts longer than 4 weeks.  You lose weight for without trying. Get help right away if:  You cannot control when you pee (urinate) or poop (have a bowel movement).  You have weakness in any of these areas and  it gets worse. ? Lower back. ? Lower belly (pelvis). ? Butt (buttocks). ? Legs.  You have redness or swelling of your back.  You have a burning feeling when you pee. This information is not intended to replace advice given to you by your health care provider. Make sure you discuss any questions you have with your health care provider. Document Released: 06/28/2008 Document Revised: 02/25/2016 Document Reviewed: 05/29/2015 Elsevier Interactive Patient Education  2019 [employer].    Back Exercises If you have pain in your back, do these exercises 2-3 times each day or as told by your doctor. When the pain goes away, do the exercises once each day, but repeat the steps more  times for each exercise (do more repetitions). If you do not have pain in your back, do these exercises once each day or as told by your doctor. Exercises Single Knee to Chest Do these steps 3-5 times in a row for each leg: 1. Lie on your back on a firm bed or the floor with your legs stretched out. 2. Bring one knee to your chest. 3. Hold your knee to your chest by grabbing your knee or thigh. 4. Pull on your knee until you feel a gentle stretch in your lower back. 5. Keep doing the stretch for 10-30 seconds. 6. Slowly let go of your leg and straighten it. Pelvic Tilt Do these steps 5-10 times in a row: 1. Lie on your back on a firm bed or the floor with your legs stretched out. 2. Bend your knees so they point up to the ceiling. Your feet should be flat on the floor. 3. Tighten your lower belly (abdomen) muscles to press your lower back against the floor. This will make your tailbone point up to the ceiling instead of pointing down to your feet or the floor. 4. Stay in this position for 5-10 seconds while you gently tighten your muscles and breathe evenly. Cat-Cow Do these steps until your lower back bends more easily: 1. Get on your hands and knees on a firm surface. Keep your hands under your shoulders, and keep your knees under your hips. You may put padding under your knees. 2. Let your head hang down, and make your tailbone point down to the floor so your lower back is round like the back of a cat. 3. Stay in this position for 5 seconds. 4. Slowly lift your head and make your tailbone point up to the ceiling so your back hangs low (sags) like the back of a cow. 5. Stay in this position for 5 seconds.  Press-Ups Do these steps 5-10 times in a row: 1. Lie on your belly (face-down) on the floor. 2. Place your hands near your head, about shoulder-width apart. 3. While you keep your back relaxed and keep your hips on the floor, slowly straighten your arms to raise the top half of your  body and lift your shoulders. Do not use your back muscles. To make yourself more comfortable, you may change where you place your hands. 4. Stay in this position for 5 seconds. 5. Slowly return to lying flat on the floor.  Bridges Do these steps 10 times in a row: 1. Lie on your back on a firm surface. 2. Bend your knees so they point up to the ceiling. Your feet should be flat on the floor. 3. Tighten your butt muscles and lift your butt off of the floor until your waist is almost as high as your  knees. If you do not feel the muscles working in your butt and the back of your thighs, slide your feet 1-2 inches farther away from your butt. 4. Stay in this position for 3-5 seconds. 5. Slowly lower your butt to the floor, and let your butt muscles relax. If this exercise is too easy, try doing it with your arms crossed over your chest. Belly Crunches Do these steps 5-10 times in a row: 1. Lie on your back on a firm bed or the floor with your legs stretched out. 2. Bend your knees so they point up to the ceiling. Your feet should be flat on the floor. 3. Cross your arms over your chest. 4. Tip your chin a little bit toward your chest but do not bend your neck. 5. Tighten your belly muscles and slowly raise your chest just enough to lift your shoulder blades a tiny bit off of the floor. 6. Slowly lower your chest and your head to the floor. Back Lifts Do these steps 5-10 times in a row: 1. Lie on your belly (face-down) with your arms at your sides, and rest your forehead on the floor. 2. Tighten the muscles in your legs and your butt. 3. Slowly lift your chest off of the floor while you keep your hips on the floor. Keep the back of your head in line with the curve in your back. Look at the floor while you do this. 4. Stay in this position for 3-5 seconds. 5. Slowly lower your chest and your face to the floor. Contact a doctor if:  Your back pain gets a lot worse when you do an  exercise.  Your back pain does not lessen 2 hours after you exercise. If you have any of these problems, stop doing the exercises. Do not do them again unless your doctor says it is okay. Get help right away if:  You have sudden, very bad back pain. If this happens, stop doing the exercises. Do not do them again unless your doctor says it is okay. This information is not intended to replace advice given to you by your health care provider. Make sure you discuss any questions you have with your health care provider. Document Released: 10/22/2010 Document Revised: 06/13/2018 Document Reviewed: 11/13/2014 Elsevier Interactive Patient Education  [employer]

## 2018-09-22 LAB — CBC WITH DIFFERENTIAL/PLATELET
Basophils Absolute: 0 10*3/uL (ref 0.0–0.2)
Basos: 1 %
EOS (ABSOLUTE): 0.2 10*3/uL (ref 0.0–0.4)
Eos: 4 %
Hematocrit: 41.2 % (ref 34.0–46.6)
Hemoglobin: 13.7 g/dL (ref 11.1–15.9)
Immature Grans (Abs): 0 10*3/uL (ref 0.0–0.1)
Immature Granulocytes: 1 %
Lymphocytes Absolute: 2.4 10*3/uL (ref 0.7–3.1)
Lymphs: 43 %
MCH: 29 pg (ref 26.6–33.0)
MCHC: 33.3 g/dL (ref 31.5–35.7)
MCV: 87 fL (ref 79–97)
Monocytes Absolute: 0.4 10*3/uL (ref 0.1–0.9)
Monocytes: 7 %
Neutrophils Absolute: 2.4 10*3/uL (ref 1.4–7.0)
Neutrophils: 44 %
Platelets: 218 10*3/uL (ref 150–450)
RBC: 4.72 x10E6/uL (ref 3.77–5.28)
RDW: 12.7 % (ref 12.3–15.4)
WBC: 5.5 10*3/uL (ref 3.4–10.8)

## 2018-09-22 LAB — MICROALBUMIN / CREATININE URINE RATIO
Creatinine, Urine: 113.6 mg/dL
Microalb/Creat Ratio: <2.6 mg/g creat (ref 0.0–30.0)
Microalbumin, Urine: <3 ug/mL

## 2018-09-22 LAB — BASIC METABOLIC PANEL
BUN/Creatinine Ratio: 12 (ref 9–23)
BUN: 10 mg/dL (ref 6–24)
CO2: 24 mmol/L (ref 20–29)
Calcium: 9.7 mg/dL (ref 8.7–10.2)
Chloride: 102 mmol/L (ref 96–106)
Creatinine, Ser: 0.84 mg/dL (ref 0.57–1.00)
GFR calc Af Amer: 94 mL/min/{1.73_m2} (ref >59)
GFR calc non Af Amer: 81 mL/min/{1.73_m2} (ref >59)
Glucose: 95 mg/dL (ref 65–99)
Potassium: 3.8 mmol/L (ref 3.5–5.2)
Sodium: 139 mmol/L (ref 134–144)

## 2018-09-22 LAB — VITAMIN D 25 HYDROXY (VIT D DEFICIENCY, FRACTURES): Vit D, 25-Hydroxy: 22.5 ng/mL — ABNORMAL LOW (ref 30.0–100.0)

## 2018-09-22 LAB — TSH: TSH: 1.71 u[IU]/mL (ref 0.450–4.500)

## 2018-09-24 ENCOUNTER — Ambulatory Visit: Payer: Self-pay | Admitting: Family Medicine

## 2018-10-05 ENCOUNTER — Ambulatory Visit (INDEPENDENT_AMBULATORY_CARE_PROVIDER_SITE_OTHER): Payer: Medicaid Other | Admitting: Family Medicine

## 2018-10-05 ENCOUNTER — Encounter: Payer: Self-pay | Admitting: Family Medicine

## 2018-10-05 VITALS — BP 130/69 | HR 64 | Temp 97.8°F | Resp 16 | Ht 58.5 in | Wt 139.0 lb

## 2018-10-05 DIAGNOSIS — E785 Hyperlipidemia, unspecified: Secondary | ICD-10-CM

## 2018-10-05 DIAGNOSIS — I10 Essential (primary) hypertension: Secondary | ICD-10-CM | POA: Diagnosis not present

## 2018-10-05 NOTE — Progress Notes (Signed)
  Patient [facility]   Progress Note: General Provider: Lanae Boast, FNP  SUBJECTIVE:   Lauren Petersen is a 53 y.o. female who  has no past medical history on file.. Patient presents today for Hypertension  patient reports taking medications as prescribed. BP reading in normal range today. She presents for fasting labs. States that she had a pap smear at the [facility] and does not want one today. Willing to sign release of records for results.   Review of Systems  Constitutional: Negative.   HENT: Negative.   Eyes: Negative.   Respiratory: Negative.   Cardiovascular: Negative.   Gastrointestinal: Negative.   Genitourinary: Negative.   Musculoskeletal: Negative.   Skin: Negative.   Neurological: Negative.   Psychiatric/Behavioral: Negative.      OBJECTIVE: BP 130/69 (BP Location: Left Arm, Patient Position: Sitting, Cuff Size: Normal)   Pulse 64   Temp 97.8 F (36.6 C) (Oral)   Resp 16   Ht 4' 10.5" (1.486 m)   Wt 139 lb (63 kg)   LMP 07/22/2018   SpO2 100%   BMI 28.56 kg/m   Wt Readings from Last 3 Encounters:  10/05/18 139 lb (63 kg)  09/21/18 140 lb (63.5 kg)  02/15/17 139 lb (63 kg)     Physical Exam Vitals signs and nursing note reviewed.  Constitutional:      General: She is not in acute distress.    Appearance: She is well-developed.  HENT:     Head: Normocephalic and atraumatic.  Eyes:     Conjunctiva/sclera: Conjunctivae normal.     Pupils: Pupils are equal, round, and reactive to light.  Neck:     Musculoskeletal: Normal range of motion.  Cardiovascular:     Rate and Rhythm: Normal rate and regular rhythm.     Heart sounds: Normal heart sounds.  Pulmonary:     Effort: Pulmonary effort is normal. No respiratory distress.     Breath sounds: Normal breath sounds.  Musculoskeletal: Normal range of motion.  Skin:    General: Skin is warm and dry.  Neurological:     Mental Status:  She is alert and oriented to person, place, and time. Mental status is at baseline.  Psychiatric:        Behavior: Behavior normal.        Thought Content: Thought content normal.        Judgment: Judgment normal.     ASSESSMENT/PLAN:  1. Essential hypertension Continue with current medications.    BP 130/69 today.  2. Hyperlipidemia, unspecified hyperlipidemia type Labs pending.  - Lipid Panel     Return in about 3 months (around 01/04/2019) for HTN and PAP. Requesting records from Health department.    The patient was given clear instructions to go to ER or return to medical center if symptoms do not improve, worsen or new problems develop. The patient verbalized understanding and agreed with plan of care.   Ms. Doug Sou. Nathaneil Canary, FNP-BC [facility] [address] [place], [place] [address] [phone]

## 2018-10-05 NOTE — Patient Instructions (Signed)

## 2018-10-06 LAB — LIPID PANEL
Chol/HDL Ratio: 3 ratio (ref 0.0–4.4)
Cholesterol, Total: 149 mg/dL (ref 100–199)
HDL: 50 mg/dL (ref >39)
LDL Calculated: 85 mg/dL (ref 0–99)
Triglycerides: 71 mg/dL (ref 0–149)
VLDL Cholesterol Cal: 14 mg/dL (ref 5–40)

## 2018-10-09 ENCOUNTER — Telehealth: Payer: Self-pay

## 2018-10-09 NOTE — Telephone Encounter (Signed)
-----   Message from Lanae Boast, [place] sent at 10/09/2018 12:13 PM EST ----- Cholesterol and triglycerides have improved.  No medication changes.

## 2018-10-09 NOTE — Telephone Encounter (Signed)
Called and left a message that labs have improved and that she should continue current medications. Asked if any questions to let us know. Thanks!

## 2018-10-11 ENCOUNTER — Telehealth: Payer: Self-pay

## 2018-10-11 NOTE — Telephone Encounter (Signed)
Called back and advised that all labs were good and to continue current medication. Thanks!

## 2018-10-26 DIAGNOSIS — Z3200 Encounter for pregnancy test, result unknown: Secondary | ICD-10-CM | POA: Diagnosis not present

## 2018-10-26 DIAGNOSIS — Z3009 Encounter for other general counseling and advice on contraception: Secondary | ICD-10-CM | POA: Diagnosis not present

## 2018-10-26 DIAGNOSIS — Z3041 Encounter for surveillance of contraceptive pills: Secondary | ICD-10-CM | POA: Diagnosis not present

## 2018-10-30 ENCOUNTER — Ambulatory Visit
Admission: RE | Admit: 2018-10-30 | Discharge: 2018-10-30 | Disposition: A | Discharge status: Home / Self Care | Payer: Medicaid Other | Source: Ambulatory Visit | Attending: Family Medicine | Admitting: Family Medicine

## 2018-10-30 DIAGNOSIS — Z1231 Encounter for screening mammogram for malignant neoplasm of breast: Secondary | ICD-10-CM

## 2018-11-02 DIAGNOSIS — Z3041 Encounter for surveillance of contraceptive pills: Secondary | ICD-10-CM | POA: Diagnosis not present

## 2018-11-02 DIAGNOSIS — N959 Unspecified menopausal and perimenopausal disorder: Secondary | ICD-10-CM | POA: Diagnosis not present

## 2018-12-10 ENCOUNTER — Encounter: Payer: Self-pay | Admitting: Family Medicine

## 2018-12-11 ENCOUNTER — Encounter: Payer: Self-pay | Admitting: Family Medicine

## 2018-12-18 DIAGNOSIS — Z113 Encounter for screening for infections with a predominantly sexual mode of transmission: Secondary | ICD-10-CM | POA: Diagnosis not present

## 2018-12-18 DIAGNOSIS — N760 Acute vaginitis: Secondary | ICD-10-CM | POA: Diagnosis not present

## 2018-12-18 DIAGNOSIS — Z3041 Encounter for surveillance of contraceptive pills: Secondary | ICD-10-CM | POA: Diagnosis not present

## 2018-12-18 DIAGNOSIS — N959 Unspecified menopausal and perimenopausal disorder: Secondary | ICD-10-CM | POA: Diagnosis not present

## 2019-01-04 ENCOUNTER — Ambulatory Visit: Payer: Medicaid Other | Admitting: Family Medicine

## 2019-03-06 ENCOUNTER — Encounter (HOSPITAL_COMMUNITY): Payer: Self-pay | Admitting: Obstetrics & Gynecology

## 2019-04-22 DIAGNOSIS — Z7189 Other specified counseling: Secondary | ICD-10-CM | POA: Diagnosis not present

## 2019-04-22 DIAGNOSIS — Z03818 Encounter for observation for suspected exposure to other biological agents ruled out: Secondary | ICD-10-CM | POA: Diagnosis not present

## 2019-04-24 DIAGNOSIS — Z20828 Contact with and (suspected) exposure to other viral communicable diseases: Secondary | ICD-10-CM | POA: Diagnosis not present

## 2019-06-12 ENCOUNTER — Encounter (HOSPITAL_COMMUNITY): Payer: Self-pay | Admitting: *Deleted

## 2019-06-12 ENCOUNTER — Encounter (HOSPITAL_COMMUNITY): Payer: Self-pay

## 2019-07-02 DIAGNOSIS — Z113 Encounter for screening for infections with a predominantly sexual mode of transmission: Secondary | ICD-10-CM | POA: Diagnosis not present

## 2019-07-02 DIAGNOSIS — Z3009 Encounter for other general counseling and advice on contraception: Secondary | ICD-10-CM | POA: Diagnosis not present

## 2019-07-02 DIAGNOSIS — Z3202 Encounter for pregnancy test, result negative: Secondary | ICD-10-CM | POA: Diagnosis not present

## 2019-07-02 DIAGNOSIS — Z0389 Encounter for observation for other suspected diseases and conditions ruled out: Secondary | ICD-10-CM | POA: Diagnosis not present

## 2019-07-02 DIAGNOSIS — Z3041 Encounter for surveillance of contraceptive pills: Secondary | ICD-10-CM | POA: Diagnosis not present

## 2019-07-02 DIAGNOSIS — N959 Unspecified menopausal and perimenopausal disorder: Secondary | ICD-10-CM | POA: Diagnosis not present

## 2019-07-02 DIAGNOSIS — Z1388 Encounter for screening for disorder due to exposure to contaminants: Secondary | ICD-10-CM | POA: Diagnosis not present

## 2019-07-04 DIAGNOSIS — L210 Seborrhea capitis: Secondary | ICD-10-CM

## 2019-07-04 HISTORY — DX: Seborrhea capitis: L21.0

## 2019-07-15 ENCOUNTER — Encounter: Payer: Self-pay | Admitting: Family Medicine

## 2019-07-15 ENCOUNTER — Other Ambulatory Visit: Payer: Self-pay

## 2019-07-15 ENCOUNTER — Ambulatory Visit (INDEPENDENT_AMBULATORY_CARE_PROVIDER_SITE_OTHER): Payer: Medicaid Other | Admitting: Family Medicine

## 2019-07-15 VITALS — BP 125/58 | HR 96 | Ht <= 58 in | Wt 134.3 lb

## 2019-07-15 DIAGNOSIS — R109 Unspecified abdominal pain: Secondary | ICD-10-CM | POA: Diagnosis not present

## 2019-07-15 DIAGNOSIS — I10 Essential (primary) hypertension: Secondary | ICD-10-CM | POA: Insufficient documentation

## 2019-07-15 DIAGNOSIS — Z09 Encounter for follow-up examination after completed treatment for conditions other than malignant neoplasm: Secondary | ICD-10-CM

## 2019-07-15 DIAGNOSIS — L210 Seborrhea capitis: Secondary | ICD-10-CM | POA: Insufficient documentation

## 2019-07-15 DIAGNOSIS — G629 Polyneuropathy, unspecified: Secondary | ICD-10-CM | POA: Diagnosis not present

## 2019-07-15 DIAGNOSIS — E785 Hyperlipidemia, unspecified: Secondary | ICD-10-CM | POA: Diagnosis not present

## 2019-07-15 DIAGNOSIS — L659 Nonscarring hair loss, unspecified: Secondary | ICD-10-CM | POA: Insufficient documentation

## 2019-07-15 LAB — POCT URINALYSIS DIPSTICK
Bilirubin, UA: NEGATIVE
Blood, UA: NEGATIVE
Glucose, UA: NEGATIVE
Ketones, UA: NEGATIVE
Leukocytes, UA: NEGATIVE
Nitrite, UA: NEGATIVE
Protein, UA: POSITIVE — AB
Spec Grav, UA: >=1.03 — AB (ref 1.010–1.025)
Urobilinogen, UA: 0.2 E.U./dL
pH, UA: 6 (ref 5.0–8.0)

## 2019-07-15 MED ORDER — GABAPENTIN 100 MG PO CAPS: 100.0000 mg | ORAL_CAPSULE | Freq: Every day | ORAL | 3 refills | Status: DC

## 2019-07-15 MED ORDER — GABAPENTIN 300 MG PO CAPS: 300.0000 mg | ORAL_CAPSULE | Freq: Every day | ORAL | 6 refills | Status: DC

## 2019-07-15 MED ORDER — SELENIUM SULFIDE 2.5 % EX LOTN: 1.0000 "application " | TOPICAL_LOTION | Freq: Every day | CUTANEOUS | 12 refills | Status: DC | PRN

## 2019-07-15 NOTE — Progress Notes (Signed)
Patient [facility]   Sick Visit  Subjective:  Patient ID: Lauren Petersen, female    DOB: [birth year]  Age: 53 y.o. MRN: [id number]  CC:  Chief Complaint  Patient presents with  . Pain    left side pain, left left leg pain   . Alopecia  . Headache  . Urinary Tract Infection    frequent  voids    HPI Lauren Petersen is a 53 year old female who presents for Sick Visit today.   History reviewed. No pertinent past medical history.  Current Status: This will be Lauren Petersen's initial office visit with me. She was previously seeing Lanae Boast, NP for her PCP needs. Since her last office visit, she has c/o left extremity and left leg pain X 5-6 years. She denies any trauma to her left extremity. She is currently taking Acetaminphenopin for aide in relieving her pain and discomfort, which is effective. She also has c/o increased scalp dander. She does have a history of Alopecia. She denies fevers, chills, fatigue, recent infections, weight loss, and night sweats. She has not had any headaches, visual changes, dizziness, and falls. No chest pain, heart palpitations, cough and shortness of breath reported. No reports of GI problems such as nausea, vomiting, diarrhea, and constipation. She has no reports of blood in stools, dysuria and hematuria. No depression or anxiety reported today.   Past Surgical History:  Procedure Laterality Date  . CESAREAN SECTION      History reviewed. No pertinent family history.  Social History   Socioeconomic History  . Marital status: Married    Spouse name: Not on file  . Number of children: Not on file  . Years of education: Not on file  . Highest education level: Not on file  Occupational History  . Not on file  Social Needs  . Financial resource strain: Not on file  . Food insecurity    Worry: Not on file    Inability: Not on file  . Transportation needs    Medical: Not on file   Non-medical: Not on file  Tobacco Use  . Smoking status: Never Smoker  . Smokeless tobacco: Never Used  Substance and Sexual Activity  . Alcohol use: No  . Drug use: No  . Sexual activity: Yes    Birth control/protection: I.U.D.  Lifestyle  . Physical activity    Days per week: Not on file    Minutes per session: Not on file  . Stress: Not on file  Relationships  . Social Herbalist on phone: Not on file    Gets together: Not on file    Attends religious service: Not on file    Active member of club or organization: Not on file    Attends meetings of clubs or organizations: Not on file    Relationship status: Not on file  . Intimate partner violence    Fear of current or ex partner: Not on file    Emotionally abused: Not on file    Physically abused: Not on file    Forced sexual activity: Not on file  Other Topics Concern  . Not on file  Social History Narrative  . Not on file    Outpatient Medications Prior to Visit  Medication Sig Dispense Refill  . hydrochlorothiazide (HYDRODIURIL) 25 MG tablet Take 1 tablet (25 mg total) by mouth daily. 90 tablet 3  . norethindrone (MICRONOR,CAMILA,ERRIN) 0.35 MG tablet  Take 1 tablet by mouth daily.    Danelle Earthly Estradiol (VYFEMLA PO) Take 1 tablet by mouth daily.    Marland Kitchen senna-docusate (SENOKOT S) 8.6-50 MG per tablet Take 1 tablet by mouth at bedtime as needed for mild constipation. (Patient not taking: Reported on 09/19/2016) 30 tablet 2  . simethicone (GAS-X) 80 MG chewable tablet Chew 1 tablet (80 mg total) by mouth every 6 (six) hours as needed for flatulence (take one tab at bed time everynight). (Patient not taking: Reported on 10/05/2018) 60 tablet 3   No facility-administered medications prior to visit.     No Known Allergies  ROS Review of Systems  Constitutional: Negative.   HENT: Negative.   Eyes: Negative.   Respiratory: Negative.   Cardiovascular: Negative.   Gastrointestinal: Negative.    Endocrine: Negative.   Genitourinary: Negative.   Musculoskeletal: Positive for arthralgias (generalized joint pain).  Skin:       Increased dander in scalp  Allergic/Immunologic: Negative.   Neurological: Positive for dizziness, numbness (numbness/pain of left extremity) and headaches.  Hematological: Negative.   Psychiatric/Behavioral: Negative.      Objective:    Physical Exam  Constitutional: She is oriented to person, place, and time. She appears well-developed and well-nourished.  HENT:  Head: Normocephalic and atraumatic.  Eyes: Conjunctivae are normal.  Neck: Normal range of motion. Neck supple.  Cardiovascular: Normal rate, regular rhythm, normal heart sounds and intact distal pulses.  Pulmonary/Chest: Effort normal and breath sounds normal.  Abdominal: Soft. Bowel sounds are normal.  Musculoskeletal: Normal range of motion.  Neurological: She is alert and oriented to person, place, and time. She has normal reflexes.  Skin: Skin is warm and dry.  Flaky dry dander noted in scalp.  Psychiatric: She has a normal mood and affect. Her behavior is normal. Judgment and thought content normal.  Nursing note and vitals reviewed.   BP (!) 125/58 (BP Location: Left Arm, Patient Position: Sitting, Cuff Size: Normal)   Pulse 96   Ht 4\' 10"  (1.473 m)   Wt 134 lb 4.8 oz (60.9 kg)   LMP 06/04/2019   SpO2 99%   BMI 28.07 kg/m  Wt Readings from Last 3 Encounters:  07/15/19 134 lb 4.8 oz (60.9 kg)  10/05/18 139 lb (63 kg)  09/21/18 140 lb (63.5 kg)     Health Maintenance Due  Topic Date Due  . PAP SMEAR-Modifier  10/17/2016  . COLONOSCOPY  10/03/2017  . INFLUENZA VACCINE  05/04/2019    There are no preventive care reminders to display for this patient.  Lab Results  Component Value Date   TSH 1.710 09/21/2018   Lab Results  Component Value Date   WBC 5.5 09/21/2018   HGB 13.7 09/21/2018   HCT 41.2 09/21/2018   MCV 87 09/21/2018   PLT 218 09/21/2018   Lab  Results  Component Value Date   NA 139 09/21/2018   K 3.8 09/21/2018   CO2 24 09/21/2018   GLUCOSE 95 09/21/2018   BUN 10 09/21/2018   CREATININE 0.84 09/21/2018   BILITOT 0.3 12/26/2014   ALKPHOS 52 12/26/2014   AST 15 12/26/2014   ALT 9 12/26/2014   PROT 7.3 12/26/2014   ALBUMIN 4.2 12/26/2014   CALCIUM 9.7 09/21/2018   Lab Results  Component Value Date   CHOL 149 10/05/2018   Lab Results  Component Value Date   HDL 50 10/05/2018   Lab Results  Component Value Date   LDLCALC 85 10/05/2018   Lab  Results  Component Value Date   TRIG 71 10/05/2018   Lab Results  Component Value Date   CHOLHDL 3.0 10/05/2018   Lab Results  Component Value Date   HGBA1C 5.7 (H) 12/26/2014   Assessment & Plan:   1. Seborrhea capitis in adult - selenium sulfide (SELSUN) 2.5 % shampoo; Apply 1 application topically daily as needed for irritation.  Dispense: 118 mL; Refill: 12  2. Alopecia of scalp  3. Left sided abdominal pain - POCT urinalysis dipstick  4. Neuropathy We will increase Gabapentin to 300 mg QHS.  - gabapentin (NEURONTIN) 300 MG capsule; Take 1 capsule (100 mg total) by mouth at bedtime.  Dispense: 30 capsule; Refill: 6  5. Hyperlipidemia, unspecified hyperlipidemia type  6. Essential hypertension The current medical regimen is effective; blood pressure is stable at 125/58 today; continue present plan and medications as prescribed. She will continue to take medications as prescribed, to decrease high sodium intake, excessive alcohol intake, increase potassium intake, smoking cessation, and increase physical activity of at least 30 minutes of cardio activity daily. She will continue to follow Heart Healthy or DASH diet.  7. Follow up She will follow up in 3 month.  Meds ordered this encounter  Medications  . gabapentin (NEURONTIN) 100 MG capsule    Sig: Take 1 capsule (100 mg total) by mouth at bedtime.    Dispense:  30 capsule    Refill:  3  . selenium  sulfide (SELSUN) 2.5 % shampoo    Sig: Apply 1 application topically daily as needed for irritation.    Dispense:  118 mL    Refill:  12   Orders Placed This Encounter  Procedures  . POCT urinalysis dipstick    Referral Orders  No referral(s) requested today    Kathe Becton,  MSN, FNP-BC [place], [place] [address] [phone] fax    Problem List Items Addressed This Visit    None    Visit Diagnoses    Left sided abdominal pain    -  Primary   Relevant Orders   POCT urinalysis dipstick      No orders of the defined types were placed in this encounter.   Follow-up: No follow-ups on file.    Azzie Glatter, FNP

## 2019-07-15 NOTE — Patient Instructions (Signed)
Neuropathic Pain °Neuropathic pain is pain caused by damage to the nerves that are responsible for certain sensations in your body (sensory nerves). The pain can be caused by: °· Damage to the sensory nerves that send signals to your spinal cord and brain (peripheral nervous system). °· Damage to the sensory nerves in your brain or spinal cord (central nervous system). °Neuropathic pain can make you more sensitive to pain. Even a minor sensation can feel very painful. This is usually a long-term condition that can be difficult to treat. The type of pain differs from person to person. It may: °· Start suddenly (acute), or it may develop slowly and last for a long time (chronic). °· Come and go as damaged nerves heal, or it may stay at the same level for years. °· Cause emotional distress, loss of sleep, and a lower quality of life. °What are the causes? °The most common cause of this condition is diabetes. Many other diseases and conditions can also cause neuropathic pain. Causes of neuropathic pain can be classified as: °· Toxic. This is caused by medicines and chemicals. The most common cause of toxic neuropathic pain is damage from cancer treatments (chemotherapy). °· Metabolic. This can be caused by: °? Diabetes. This is the most common disease that damages the nerves. °? Lack of vitamin B from long-term alcohol abuse. °· Traumatic. Any injury that cuts, crushes, or stretches a nerve can cause damage and pain. A common example is feeling pain after losing an arm or leg (phantom limb pain). °· Compression-related. If a sensory nerve gets trapped or compressed for a long period of time, the blood supply to the nerve can be cut off. °· Vascular. Many blood vessel diseases can cause neuropathic pain by decreasing blood supply and oxygen to nerves. °· Autoimmune. This type of pain results from diseases in which the body's defense system (immune system) mistakenly attacks sensory nerves. Examples of autoimmune diseases  that can cause neuropathic pain include lupus and multiple sclerosis. °· Infectious. Many types of viral infections can damage sensory nerves and cause pain. Shingles infection is a common cause of this type of pain. °· Inherited. Neuropathic pain can be a symptom of many diseases that are passed down through families (genetic). °What increases the risk? °You are more likely to develop this condition if: °· You have diabetes. °· You smoke. °· You drink too much alcohol. °· You are taking certain medicines, including medicines that kill cancer cells (chemotherapy) or that treat immune system disorders. °What are the signs or symptoms? °The main symptom is pain. Neuropathic pain is often described as: °· Burning. °· Shock-like. °· Stinging. °· Hot or cold. °· Itching. °How is this diagnosed? °No single test can diagnose neuropathic pain. It is diagnosed based on: °· Physical exam and your symptoms. Your health care provider will ask you about your pain. You may be asked to use a pain scale to describe how bad your pain is. °· Tests. These may be done to see if you have a high sensitivity to pain and to help find the cause and location of any sensory nerve damage. They include: °? Nerve conduction studies to test how well nerve signals travel through your sensory nerves (electrodiagnostic testing). °? Stimulating your sensory nerves through electrodes on your skin and measuring the response in your spinal cord and brain (somatosensory evoked potential). °· Imaging studies, such as: °? X-rays. °? CT scan. °? MRI. °How is this treated? °Treatment for neuropathic pain may change   over time. You may need to try different treatment options or a combination of treatments. Some options include: °· Treating the underlying cause of the neuropathy, such as diabetes, kidney disease, or vitamin deficiencies. °· Stopping medicines that can cause neuropathy, such as chemotherapy. °· Medicine to relieve pain. Medicines may  include: °? Prescription or over-the-counter pain medicine. °? Anti-seizure medicine. °? Antidepressant medicines. °? Pain-relieving patches that are applied to painful areas of skin. °? A medicine to numb the area (local anesthetic), which can be injected as a nerve block. °· Transcutaneous nerve stimulation. This uses electrical currents to block painful nerve signals. The treatment is painless. °· Alternative treatments, such as: °? Acupuncture. °? Meditation. °? Massage. °? Physical therapy. °? Pain management programs. °? Counseling. °Follow these instructions at home: °Medicines ° °· Take over-the-counter and prescription medicines only as told by your health care provider. °· Do not drive or use heavy machinery while taking prescription pain medicine. °· If you are taking prescription pain medicine, take actions to prevent or treat constipation. Your health care provider may recommend that you: °? Drink enough fluid to keep your urine pale yellow. °? Eat foods that are high in fiber, such as fresh fruits and vegetables, whole grains, and beans. °? Limit foods that are high in fat and processed sugars, such as fried or sweet foods. °? Take an over-the-counter or prescription medicine for constipation. °Lifestyle ° °· Have a good support system at home. °· Consider joining a chronic pain support group. °· Do not use any products that contain nicotine or tobacco, such as cigarettes and e-cigarettes. If you need help quitting, ask your health care provider. °· Do not drink alcohol. °General instructions °· Learn as much as you can about your condition. °· Work closely with all your health care providers to find the treatment plan that works best for you. °· Ask your health care provider what activities are safe for you. °· Keep all follow-up visits as told by your health care provider. This is important. °Contact a health care provider if: °· Your pain treatments are not working. °· You are having side effects  from your medicines. °· You are struggling with tiredness (fatigue), mood changes, depression, or anxiety. °Summary °· Neuropathic pain is pain caused by damage to the nerves that are responsible for certain sensations in your body (sensory nerves). °· Neuropathic pain may come and go as damaged nerves heal, or it may stay at the same level for years. °· Neuropathic pain is usually a long-term condition that can be difficult to treat. Consider joining a chronic pain support group. °This information is not intended to replace advice given to you by your health care provider. Make sure you discuss any questions you have with your health care provider. °Document Released: 06/16/2004 Document Revised: 01/10/2019 Document Reviewed: 10/06/2017 °Elsevier Patient Education © 2020 [employer] ° ° °Gabapentin capsules or tablets °What is this medicine? °GABAPENTIN (GA ba pen tin) is used to control seizures in certain types of epilepsy. It is also used to treat certain types of nerve pain. °This medicine may be used for other purposes; ask your health care provider or pharmacist if you have questions. °COMMON BRAND NAME(S): Active-PAC with Gabapentin, Gabarone, Neurontin °What should I tell my health care provider before I take this medicine? °They need to know if you have any of these conditions: °· history of drug abuse or alcohol abuse problem °· kidney disease °· lung or breathing disease °· suicidal thoughts,   plans, or attempt; a previous suicide attempt by you or a family member °· an unusual or allergic reaction to gabapentin, other medicines, foods, dyes, or preservatives °· pregnant or trying to get pregnant °· breast-feeding °How should I use this medicine? °Take this medicine by mouth with a glass of water. Follow the directions on the prescription label. You can take it with or without food. If it upsets your stomach, take it with food. Take your medicine at regular intervals. Do not take it more often than  directed. Do not stop taking except on your doctor's advice. °If you are directed to break the 600 or 800 mg tablets in half as part of your dose, the extra half tablet should be used for the next dose. If you have not used the extra half tablet within 28 days, it should be thrown away. °A special MedGuide will be given to you by the pharmacist with each prescription and refill. Be sure to read this information carefully each time. °Talk to your pediatrician regarding the use of this medicine in children. While this drug may be prescribed for children as young as 3 years for selected conditions, precautions do apply. °Overdosage: If you think you have taken too much of this medicine contact a poison control center or emergency room at once. °NOTE: This medicine is only for you. Do not share this medicine with others. °What if I miss a dose? °If you miss a dose, take it as soon as you can. If it is almost time for your next dose, take only that dose. Do not take double or extra doses. °What may interact with this medicine? °This medicine may interact with the following medications: °· alcohol °· antihistamines for allergy, cough, and cold °· certain medicines for anxiety or sleep °· certain medicines for depression like amitriptyline, fluoxetine, sertraline °· certain medicines for seizures like phenobarbital, primidone °· certain medicines for stomach problems °· general anesthetics like halothane, isoflurane, methoxyflurane, propofol °· local anesthetics like lidocaine, pramoxine, tetracaine °· medicines that relax muscles for surgery °· narcotic medicines for pain °· phenothiazines like chlorpromazine, mesoridazine, prochlorperazine, thioridazine °This list may not describe all possible interactions. Give your health care provider a list of all the medicines, herbs, non-prescription drugs, or dietary supplements you use. Also tell them if you smoke, drink alcohol, or use illegal drugs. Some items may interact with  your medicine. °What should I watch for while using this medicine? °Visit your doctor or health care provider for regular checks on your progress. You may want to keep a record at home of how you feel your condition is responding to treatment. You may want to share this information with your doctor or health care provider at each visit. You should contact your doctor or health care provider if your seizures get worse or if you have any new types of seizures. Do not stop taking this medicine or any of your seizure medicines unless instructed by your doctor or health care provider. Stopping your medicine suddenly can increase your seizures or their severity. °This medicine may cause serious skin reactions. They can happen weeks to months after starting the medicine. Contact your health care provider right away if you notice fevers or flu-like symptoms with a rash. The rash may be red or purple and then turn into blisters or peeling of the skin. Or, you might notice a red rash with swelling of the face, lips or lymph nodes in your neck or under your arms. °Wear a   medical identification bracelet or chain if you are taking this medicine for seizures, and carry a card that lists all your medications. °You may get drowsy, dizzy, or have blurred vision. Do not drive, use machinery, or do anything that needs mental alertness until you know how this medicine affects you. To reduce dizzy or fainting spells, do not sit or stand up quickly, especially if you are an older patient. Alcohol can increase drowsiness and dizziness. Avoid alcoholic drinks. °Your mouth may get dry. Chewing sugarless gum or sucking hard candy, and drinking plenty of water will help. °The use of this medicine may increase the chance of suicidal thoughts or actions. Pay special attention to how you are responding while on this medicine. Any worsening of mood, or thoughts of suicide or dying should be reported to your health care provider right away. °Women  who become pregnant while using this medicine may enroll in the [place] Antiepileptic Drug Pregnancy Registry by calling 1-888-233-2334. This registry collects information about the safety of antiepileptic drug use during pregnancy. °What side effects may I notice from receiving this medicine? °Side effects that you should report to your doctor or health care professional as soon as possible: °· allergic reactions like skin rash, itching or hives, swelling of the face, lips, or tongue °· breathing problems °· rash, fever, and swollen lymph nodes °· redness, blistering, peeling or loosening of the skin, including inside the mouth °· suicidal thoughts, mood changes °Side effects that usually do not require medical attention (report to your doctor or health care professional if they continue or are bothersome): °· dizziness °· drowsiness °· headache °· nausea, vomiting °· swelling of ankles, feet, hands °· tiredness °This list may not describe all possible side effects. Call your doctor for medical advice about side effects. You may report side effects to FDA at 1-800-FDA-1088. °Where should I keep my medicine? °Keep out of reach of children. °This medicine may cause accidental overdose and death if it taken by other adults, children, or pets. Mix any unused medicine with a substance like cat litter or coffee grounds. Then throw the medicine away in a sealed container like a sealed bag or a coffee can with a lid. Do not use the medicine after the expiration date. °Store at room temperature between 15 and 30 degrees C (59 and 86 degrees F). °NOTE: This sheet is a summary. It may not cover all possible information. If you have questions about this medicine, talk to your doctor, pharmacist, or health care provider. °© 2020 Elsevier/[employer] (2018-12-21 14:16:43) ° °

## 2019-09-27 ENCOUNTER — Emergency Department (HOSPITAL_COMMUNITY)
Admission: EM | Admit: 2019-09-27 | Discharge: 2019-09-27 | Disposition: A | Discharge status: Home / Self Care | Payer: Medicaid Other | Attending: Emergency Medicine | Admitting: Emergency Medicine

## 2019-09-27 ENCOUNTER — Other Ambulatory Visit: Payer: Self-pay

## 2019-09-27 DIAGNOSIS — U071 COVID-19: Secondary | ICD-10-CM | POA: Insufficient documentation

## 2019-09-27 DIAGNOSIS — R05 Cough: Secondary | ICD-10-CM | POA: Insufficient documentation

## 2019-09-27 DIAGNOSIS — I10 Essential (primary) hypertension: Secondary | ICD-10-CM | POA: Insufficient documentation

## 2019-09-27 DIAGNOSIS — Z79899 Other long term (current) drug therapy: Secondary | ICD-10-CM | POA: Diagnosis not present

## 2019-09-27 DIAGNOSIS — J029 Acute pharyngitis, unspecified: Secondary | ICD-10-CM | POA: Diagnosis not present

## 2019-09-27 DIAGNOSIS — R0981 Nasal congestion: Secondary | ICD-10-CM | POA: Diagnosis not present

## 2019-09-27 DIAGNOSIS — Z20828 Contact with and (suspected) exposure to other viral communicable diseases: Secondary | ICD-10-CM | POA: Diagnosis not present

## 2019-09-27 DIAGNOSIS — R070 Pain in throat: Secondary | ICD-10-CM | POA: Diagnosis present

## 2019-09-27 DIAGNOSIS — Z20822 Contact with and (suspected) exposure to covid-19: Secondary | ICD-10-CM

## 2019-09-27 NOTE — ED Notes (Signed)
Pt verbalizes understanding of DC instructions. Pt belongings returned and is ambulatory out of ED.  

## 2019-09-27 NOTE — Discharge Instructions (Signed)
You are tested for COVID today. Even if test is negative, you need to quarantine for 14 days.   See your doctor  Return to ER if you have worse shortness of breath, chest pain, cough.      Person Under Monitoring Name: Lauren Petersen  Location: [address] [place] [address]   Infection Prevention Recommendations for Individuals Confirmed to have, or Being Evaluated for, 2019 Novel Coronavirus (COVID-19) Infection Who Receive Care at Home  Individuals who are confirmed to have, or are being evaluated for, COVID-19 should follow the prevention steps below until a healthcare provider or local or state health department says they can return to normal activities.  Stay home except to get medical care You should restrict activities outside your home, except for getting medical care. Do not go to work, school, or public areas, and do not use public transportation or taxis.  Call ahead before visiting your doctor Before your medical appointment, call the healthcare provider and tell them that you have, or are being evaluated for, COVID-19 infection. This will help the healthcare provider's office take steps to keep other people from getting infected. Ask your healthcare provider to call the local or state health department.  Monitor your symptoms Seek prompt medical attention if your illness is worsening (e.g., difficulty breathing). Before going to your medical appointment, call the healthcare provider and tell them that you have, or are being evaluated for, COVID-19 infection. Ask your healthcare provider to call the local or state health department.  Wear a facemask You should wear a facemask that covers your nose and mouth when you are in the same room with other people and when you visit a healthcare provider. People who live with or visit you should also wear a facemask while they are in the same room with you.  Separate yourself from other people in your home As  much as possible, you should stay in a different room from other people in your home. Also, you should use a separate bathroom, if available.  Avoid sharing household items You should not share dishes, drinking glasses, cups, eating utensils, towels, bedding, or other items with other people in your home. After using these items, you should wash them thoroughly with soap and water.  Cover your coughs and sneezes Cover your mouth and nose with a tissue when you cough or sneeze, or you can cough or sneeze into your sleeve. Throw used tissues in a lined trash can, and immediately wash your hands with soap and water for at least 20 seconds or use an alcohol-based hand rub.  Wash your [facility] your hands often and thoroughly with soap and water for at least 20 seconds. You can use an alcohol-based hand sanitizer if soap and water are not available and if your hands are not visibly dirty. Avoid touching your eyes, nose, and mouth with unwashed hands.   Prevention Steps for Caregivers and Household Members of Individuals Confirmed to have, or Being Evaluated for, COVID-19 Infection Being Cared for in the Home  If you live with, or provide care at home for, a person confirmed to have, or being evaluated for, COVID-19 infection please follow these guidelines to prevent infection:  Follow healthcare provider's instructions Make sure that you understand and can help the patient follow any healthcare provider instructions for all care.  Provide for the patient's basic needs You should help the patient with basic needs in the home and provide support for getting groceries, prescriptions, and  other personal needs.  Monitor the patient's symptoms If they are getting sicker, call his or her medical provider and tell them that the patient has, or is being evaluated for, COVID-19 infection. This will help the healthcare provider's office take steps to keep other people from getting infected. Ask  the healthcare provider to call the local or state health department.  Limit the number of people who have contact with the patient If possible, have only one caregiver for the patient. Other household members should stay in another home or place of residence. If this is not possible, they should stay in another room, or be separated from the patient as much as possible. Use a separate bathroom, if available. Restrict visitors who do not have an essential need to be in the home.  Keep older adults, very young children, and other sick people away from the patient Keep older adults, very young children, and those who have compromised immune systems or chronic health conditions away from the patient. This includes people with chronic heart, lung, or kidney conditions, diabetes, and cancer.  Ensure good ventilation Make sure that shared spaces in the home have good air flow, such as from an air conditioner or an opened window, weather permitting.  Wash your hands often Wash your hands often and thoroughly with soap and water for at least 20 seconds. You can use an alcohol based hand sanitizer if soap and water are not available and if your hands are not visibly dirty. Avoid touching your eyes, nose, and mouth with unwashed hands. Use disposable paper towels to dry your hands. If not available, use dedicated cloth towels and replace them when they become wet.  Wear a facemask and gloves Wear a disposable facemask at all times in the room and gloves when you touch or have contact with the patient's blood, body fluids, and/or secretions or excretions, such as sweat, saliva, sputum, nasal mucus, vomit, urine, or feces.  Ensure the mask fits over your nose and mouth tightly, and do not touch it during use. Throw out disposable facemasks and gloves after using them. Do not reuse. Wash your hands immediately after removing your facemask and gloves. If your personal clothing becomes contaminated,  carefully remove clothing and launder. Wash your hands after handling contaminated clothing. Place all used disposable facemasks, gloves, and other waste in a lined container before disposing them with other household waste. Remove gloves and wash your hands immediately after handling these items.  Do not share dishes, glasses, or other household items with the patient Avoid sharing household items. You should not share dishes, drinking glasses, cups, eating utensils, towels, bedding, or other items with a patient who is confirmed to have, or being evaluated for, COVID-19 infection. After the person uses these items, you should wash them thoroughly with soap and water.  Wash laundry thoroughly Immediately remove and wash clothes or bedding that have blood, body fluids, and/or secretions or excretions, such as sweat, saliva, sputum, nasal mucus, vomit, urine, or feces, on them. Wear gloves when handling laundry from the patient. Read and follow directions on labels of laundry or clothing items and detergent. In general, wash and dry with the warmest temperatures recommended on the label.  Clean all areas the individual has used often Clean all touchable surfaces, such as counters, tabletops, doorknobs, bathroom fixtures, toilets, phones, keyboards, tablets, and bedside tables, every day. Also, clean any surfaces that may have blood, body fluids, and/or secretions or excretions on them. Wear gloves when cleaning  surfaces the patient has come in contact with. Use a diluted bleach solution (e.g., dilute bleach with 1 part bleach and 10 parts water) or a household disinfectant with a label that says EPA-registered for coronaviruses. To make a bleach solution at home, add 1 tablespoon of bleach to 1 quart (4 cups) of water. For a larger supply, add  cup of bleach to 1 gallon (16 cups) of water. Read labels of cleaning products and follow recommendations provided on product labels. Labels contain  instructions for safe and effective use of the cleaning product including precautions you should take when applying the product, such as wearing gloves or eye protection and making sure you have good ventilation during use of the product. Remove gloves and wash hands immediately after cleaning.  Monitor yourself for signs and symptoms of illness Caregivers and household members are considered close contacts, should monitor their health, and will be asked to limit movement outside of the home to the extent possible. Follow the monitoring steps for close contacts listed on the symptom monitoring form.   ? If you have additional questions, contact your local health department or call the epidemiologist on call at [phone] (available 24/7). ? This guidance is subject to change. For the most up-to-date guidance from [facility], please refer to their website: [email or web address]

## 2019-09-27 NOTE — ED Provider Notes (Signed)
[facility] DEPT Provider Note   CSN: [id number] Arrival date & time: 09/27/19  1638     History Chief Complaint  Patient presents with  . COVID Exposure    Lajean Ahmed Albergo is a 53 y.o. female hx of HTN, HL, here with possible Covid exposure. Her father-in-law recently tested for Covid 5 days ago .  She tested negative 2 days ago. She has some sore throat and nonproductive cough and congestion .  Multiple family members also has similar symptoms .  She denies any history of asthma or COPD.   The history is provided by the patient.       Past Medical History:  Diagnosis Date  . Alopecia   . Hyperlipidemia   . Hypertension   . Seborrhea capitis in adult 07/2019    Patient Active Problem List   Diagnosis Date Noted  . Left sided abdominal pain 07/15/2019  . Seborrhea capitis in adult 07/15/2019  . Alopecia of scalp 07/15/2019  . Neuropathy 07/15/2019  . Essential hypertension 07/15/2019  . Hyperlipidemia 07/15/2019  . Language barrier to communication 09/21/2018  . Pain in left foot 02/15/2017  . Low back pain without sciatica 02/15/2017  . Flatulence 09/11/2013  . GERD (gastroesophageal reflux disease) 09/11/2013  . Pain due to dental caries 07/18/2013  . Acute pharyngitis 04/02/2013  . Sinusitis, acute 04/02/2013  . Streptococcal sore throat 04/02/2013  . Other malaise and fatigue 04/02/2013    Past Surgical History:  Procedure Laterality Date  . CESAREAN SECTION       OB History    Gravida  5   Para  5   Term  5   Preterm      AB      Living  5     SAB      TAB      Ectopic      Multiple      Live Births              No family history on file.  Social History   Tobacco Use  . Smoking status: Never Smoker  . Smokeless tobacco: Never Used  Substance Use Topics  . Alcohol use: No  . Drug use: No    Home Medications Prior to Admission medications   Medication Sig Start Date End Date Taking?  Authorizing Provider  gabapentin (NEURONTIN) 300 MG capsule Take 1 capsule (300 mg total) by mouth at bedtime. 07/15/19   Azzie Glatter, FNP  hydrochlorothiazide (HYDRODIURIL) 25 MG tablet Take 1 tablet (25 mg total) by mouth daily. 09/21/18   Lanae Boast, FNP  norethindrone (MICRONOR,CAMILA,ERRIN) 0.35 MG tablet Take 1 tablet by mouth daily.    [provider]  Norethindrone-Eth Estradiol (VYFEMLA PO) Take 1 tablet by mouth daily.    [provider]  selenium sulfide (SELSUN) 2.5 % shampoo Apply 1 application topically daily as needed for irritation. 07/15/19   Azzie Glatter, FNP  senna-docusate (SENOKOT S) 8.6-50 MG per tablet Take 1 tablet by mouth at bedtime as needed for mild constipation. Patient not taking: Reported on 09/19/2016 03/12/14   Lorayne Marek, MD  simethicone (GAS-X) 80 MG chewable tablet Chew 1 tablet (80 mg total) by mouth every 6 (six) hours as needed for flatulence (take one tab at bed time everynight). Patient not taking: Reported on 10/05/2018 09/21/18   Lanae Boast, [place]    Allergies    Patient has no known allergies.  Review of Systems   Review  of Systems  HENT: Positive for sore throat.   Respiratory: Positive for cough.   All other systems reviewed and are negative.   Physical Exam Updated Vital Signs BP (!) 144/71 (BP Location: Right Arm)   Pulse 68   Temp 98.1 F (36.7 C) (Oral)   Resp 15   SpO2 95%   Physical Exam Vitals and nursing note reviewed.  HENT:     Head: Normocephalic.     Nose: Nose normal.     Mouth/Throat:     Mouth: Mucous membranes are moist.  Eyes:     Extraocular Movements: Extraocular movements intact.     Pupils: Pupils are equal, round, and reactive to light.  Cardiovascular:     Rate and Rhythm: Normal rate and regular rhythm.     Pulses: Normal pulses.     Heart sounds: Normal heart sounds.  Pulmonary:     Effort: Pulmonary effort is normal.     Breath sounds: Normal breath sounds.    Abdominal:     General: Abdomen is flat.     Palpations: Abdomen is soft.  Musculoskeletal:        General: Normal range of motion.     Cervical back: Normal range of motion.  Skin:    General: Skin is warm.     Capillary Refill: Capillary refill takes less than 2 seconds.  Neurological:     General: No focal deficit present.     Mental Status: She is alert and oriented to person, place, and time.  Psychiatric:        Mood and Affect: Mood normal.        Behavior: Behavior normal.     ED Results / Procedures / Treatments   Labs (all labs ordered are listed, but only abnormal results are displayed) Labs Reviewed  NOVEL CORONAVIRUS, NAA (HOSP ORDER, SEND-OUT TO REF LAB; TAT 18-24 HRS)    EKG None  Radiology No results found.  Procedures Procedures (including critical care time)  Medications Ordered in ED Medications - No data to display  ED Course  I have reviewed the triage vital signs and the nursing notes.  Pertinent labs & imaging results that were available during my care of the patient were reviewed by me and considered in my medical decision making (see chart for details).    MDM Rules/Calculators/A&P                      Shauntay Natiley Rene is a 53 y.o. female here with cough, possible Covid exposure .  Has close family member who is positive.  She tested negative 2 days ago. Not hypoxic, well appearing. Will send out outpatient test. Told her to quarantine at home for 14 days even if she tested negative as per guidelines.   Daiva Huge was evaluated in Emergency Department on 09/27/2019 for the symptoms described in the history of present illness. She was evaluated in the context of the global COVID-19 pandemic, which necessitated consideration that the patient might be at risk for infection with the SARS-CoV-2 virus that causes COVID-19. Institutional protocols and algorithms that pertain to the evaluation of patients at risk for COVID-19 are in a  state of rapid change based on information released by regulatory bodies including the CDC and federal and state organizations. These policies and algorithms were followed during the patient's care in the ED.  Final Clinical Impression(s) / ED Diagnoses Final diagnoses:  None    Rx / DC  Orders ED Discharge Orders    None       Drenda Freeze, MD 09/27/19 1728

## 2019-09-27 NOTE — ED Triage Notes (Signed)
Pt arrived POV from home with 2 members of family/houshold ( sons ) CC COVID exposure. Pt reports husband tested positive on Monday 12/21 and pt tested negative twice since then. Pt reports fatigue, headache, sore throat X2days.

## 2019-09-29 ENCOUNTER — Ambulatory Visit: Payer: Self-pay

## 2019-09-29 LAB — NOVEL CORONAVIRUS, NAA (HOSP ORDER, SEND-OUT TO REF LAB; TAT 18-24 HRS): SARS-CoV-2, NAA: DETECTED — AB

## 2019-09-29 NOTE — Telephone Encounter (Signed)
Pt given Covid-19 positive results. Discussed mild, moderate and severe symptoms. Advised pt to call 911 for any respiratory issues and/dehydration. Discussed non test criteria for ending self isolation. Pt advised of way to manage symptoms at home and review isolation precautions especially the importance of washing hands frequently and wearing a mask when around others. Pt verbalized understanding. Spoke with son. Will report to HD.  Reason for Disposition . Health Information question, no triage required and triager able to answer question  Answer Assessment - Initial Assessment Questions 1. REASON FOR CALL or QUESTION: "What is your reason for calling today?" or "How can I best help you?" or "What question do you have that I can help answer?"     Covid results  Protocols used: INFORMATION ONLY CALL - NO TRIAGE-A-AH

## 2019-10-16 ENCOUNTER — Other Ambulatory Visit: Payer: Self-pay

## 2019-10-16 ENCOUNTER — Ambulatory Visit (INDEPENDENT_AMBULATORY_CARE_PROVIDER_SITE_OTHER): Payer: Medicaid Other | Admitting: Nurse Practitioner

## 2019-10-16 ENCOUNTER — Encounter: Payer: Self-pay | Admitting: Nurse Practitioner

## 2019-10-16 DIAGNOSIS — I10 Essential (primary) hypertension: Secondary | ICD-10-CM | POA: Diagnosis not present

## 2019-10-16 DIAGNOSIS — Z8616 Personal history of COVID-19: Secondary | ICD-10-CM | POA: Diagnosis not present

## 2019-10-16 MED ORDER — HYDROCHLOROTHIAZIDE 25 MG PO TABS: 25.0000 mg | ORAL_TABLET | Freq: Every day | ORAL | 3 refills | Status: DC

## 2019-10-16 NOTE — Progress Notes (Addendum)
Established Patient Office Visit  Subjective:  Patient ID: Lauren Petersen, female    DOB: 10-08-1965  Age: 54 y.o. MRN: [id number]  CC:  Chief Complaint  Patient presents with  . Hypertension    refills on medication   . Hyperlipidemia  . Cough    HPI Lauren Petersen presents for hypertension. She was dx with COV-19 on 09/27/19 this is a televisit.. She feels like all her symptoms have resolved.  She fever free. She is concerned occasional cough.  She denies any shortness of breath, fatigue, body aches, headaches, congestion, nausea vomiting or diarrhea.  She admits that her entire family was effected. She has a history of hypertension. She denies any dizziness, headaches, visual changes, shortness of breath, chest pain, swelling her legs to the point ankles.  She is taking her medication as directed.   Past Medical History:  Diagnosis Date  . Alopecia   . Hyperlipidemia   . Hypertension   . Seborrhea capitis in adult 07/2019    Past Surgical History:  Procedure Laterality Date  . CESAREAN SECTION      History reviewed. No pertinent family history.  Social History   Socioeconomic History  . Marital status: Married    Spouse name: Not on file  . Number of children: Not on file  . Years of education: Not on file  . Highest education level: Not on file  Occupational History  . Not on file  Tobacco Use  . Smoking status: Never Smoker  . Smokeless tobacco: Never Used  Substance and Sexual Activity  . Alcohol use: No  . Drug use: No  . Sexual activity: Yes    Birth control/protection: I.U.D.  Other Topics Concern  . Not on file  Social History Narrative  . Not on file   Social Determinants of Health   Financial Resource Strain:   . Difficulty of Paying Living Expenses: Not on file  Food Insecurity:   . Worried About Charity fundraiser in the Last Year: Not on file  . Ran Out of Food in the Last Year: Not on file  Transportation Needs:   . Lack of  Transportation (Medical): Not on file  . Lack of Transportation (Non-Medical): Not on file  Physical Activity:   . Days of Exercise per Week: Not on file  . Minutes of Exercise per Session: Not on file  Stress:   . Feeling of Stress : Not on file  Social Connections:   . Frequency of Communication with Friends and Family: Not on file  . Frequency of Social Gatherings with Friends and Family: Not on file  . Attends Religious Services: Not on file  . Active Member of Clubs or Organizations: Not on file  . Attends Archivist Meetings: Not on file  . Marital Status: Not on file  Intimate Partner Violence:   . Fear of Current or Ex-Partner: Not on file  . Emotionally Abused: Not on file  . Physically Abused: Not on file  . Sexually Abused: Not on file    Outpatient Medications Prior to Visit  Medication Sig Dispense Refill  . norethindrone (MICRONOR,CAMILA,ERRIN) 0.35 MG tablet Take 1 tablet by mouth daily.    . hydrochlorothiazide (HYDRODIURIL) 25 MG tablet Take 1 tablet (25 mg total) by mouth daily. 90 tablet 3  . gabapentin (NEURONTIN) 300 MG capsule Take 1 capsule (300 mg total) by mouth at bedtime. (Patient not taking: Reported on 10/16/2019) 30 capsule 6  . Norethindrone-Eth Estradiol (  VYFEMLA PO) Take 1 tablet by mouth daily.    Marland Kitchen selenium sulfide (SELSUN) 2.5 % shampoo Apply 1 application topically daily as needed for irritation. (Patient not taking: Reported on 10/16/2019) 118 mL 12  . senna-docusate (SENOKOT S) 8.6-50 MG per tablet Take 1 tablet by mouth at bedtime as needed for mild constipation. (Patient not taking: Reported on 09/19/2016) 30 tablet 2  . simethicone (GAS-X) 80 MG chewable tablet Chew 1 tablet (80 mg total) by mouth every 6 (six) hours as needed for flatulence (take one tab at bed time everynight). (Patient not taking: Reported on 10/05/2018) 60 tablet 3   No facility-administered medications prior to visit.    No Known Allergies  ROS Review of  Systems  Constitutional: Negative.   HENT: Negative.   Eyes: Negative.   Respiratory: Positive for cough.   Cardiovascular: Negative.   Gastrointestinal: Negative.   Endocrine: Negative.   Genitourinary: Negative.   Musculoskeletal: Negative.   Skin: Negative.   Allergic/Immunologic: Negative.   Neurological: Negative.   Hematological: Negative.   Psychiatric/Behavioral: Negative.       Objective:    Physical Exam   Tele-med visit  There were no vitals taken for this visit. Wt Readings from Last 3 Encounters:  07/15/19 134 lb 4.8 oz (60.9 kg)  10/05/18 139 lb (63 kg)  09/21/18 140 lb (63.5 kg)     Health Maintenance Due  Topic Date Due  . PAP SMEAR-Modifier  10/17/2016  . COLONOSCOPY  10/03/2017  . INFLUENZA VACCINE  05/04/2019    There are no preventive care reminders to display for this patient.  Lab Results  Component Value Date   TSH 1.710 09/21/2018   Lab Results  Component Value Date   WBC 5.5 09/21/2018   HGB 13.7 09/21/2018   HCT 41.2 09/21/2018   MCV 87 09/21/2018   PLT 218 09/21/2018   Lab Results  Component Value Date   NA 139 09/21/2018   K 3.8 09/21/2018   CO2 24 09/21/2018   GLUCOSE 95 09/21/2018   BUN 10 09/21/2018   CREATININE 0.84 09/21/2018   BILITOT 0.3 12/26/2014   ALKPHOS 52 12/26/2014   AST 15 12/26/2014   ALT 9 12/26/2014   PROT 7.3 12/26/2014   ALBUMIN 4.2 12/26/2014   CALCIUM 9.7 09/21/2018   Lab Results  Component Value Date   CHOL 149 10/05/2018   Lab Results  Component Value Date   HDL 50 10/05/2018   Lab Results  Component Value Date   LDLCALC 85 10/05/2018   Lab Results  Component Value Date   TRIG 71 10/05/2018   Lab Results  Component Value Date   CHOLHDL 3.0 10/05/2018   Lab Results  Component Value Date   HGBA1C 5.7 (H) 12/26/2014      Assessment & Plan:   Problem List Items Addressed This Visit      High   Essential hypertension   Relevant Medications   hydrochlorothiazide  (HYDRODIURIL) 25 MG tablet      Meds ordered this encounter  Medications  . hydrochlorothiazide (HYDRODIURIL) 25 MG tablet    Sig: Take 1 tablet (25 mg total) by mouth daily.    Dispense:  90 tablet    Refill:  3    Order Specific Question:   Supervising Provider    Answer:   Tresa Garter [id number]    Follow-up: Return in about 2 months (around 12/14/2019) for follow up.   Telephone visit 8 minute visit   Savera Donson M  Edison Pace, NP

## 2019-10-16 NOTE — Patient Instructions (Signed)

## 2019-10-25 ENCOUNTER — Other Ambulatory Visit: Payer: Self-pay | Admitting: Family Medicine

## 2019-10-25 DIAGNOSIS — Z1231 Encounter for screening mammogram for malignant neoplasm of breast: Secondary | ICD-10-CM

## 2019-11-11 DIAGNOSIS — H40033 Anatomical narrow angle, bilateral: Secondary | ICD-10-CM | POA: Diagnosis not present

## 2019-11-11 DIAGNOSIS — H16223 Keratoconjunctivitis sicca, not specified as Sjogren's, bilateral: Secondary | ICD-10-CM | POA: Diagnosis not present

## 2019-12-02 ENCOUNTER — Ambulatory Visit: Payer: Medicaid Other

## 2019-12-05 ENCOUNTER — Other Ambulatory Visit: Payer: Self-pay

## 2019-12-05 ENCOUNTER — Ambulatory Visit
Admission: RE | Admit: 2019-12-05 | Discharge: 2019-12-05 | Disposition: A | Discharge status: Home / Self Care | Payer: Medicaid Other | Source: Ambulatory Visit | Attending: Family Medicine | Admitting: Family Medicine

## 2019-12-05 ENCOUNTER — Other Ambulatory Visit: Payer: Self-pay | Admitting: Family Medicine

## 2019-12-05 DIAGNOSIS — Z1231 Encounter for screening mammogram for malignant neoplasm of breast: Secondary | ICD-10-CM

## 2019-12-06 ENCOUNTER — Ambulatory Visit (INDEPENDENT_AMBULATORY_CARE_PROVIDER_SITE_OTHER): Payer: Medicaid Other | Admitting: Nurse Practitioner

## 2019-12-06 ENCOUNTER — Encounter: Payer: Self-pay | Admitting: Nurse Practitioner

## 2019-12-06 VITALS — BP 136/66 | HR 63 | Temp 98.5°F | Resp 14 | Ht <= 58 in | Wt 135.0 lb

## 2019-12-06 DIAGNOSIS — L659 Nonscarring hair loss, unspecified: Secondary | ICD-10-CM

## 2019-12-06 DIAGNOSIS — I10 Essential (primary) hypertension: Secondary | ICD-10-CM

## 2019-12-06 DIAGNOSIS — Z0000 Encounter for general adult medical examination without abnormal findings: Secondary | ICD-10-CM

## 2019-12-06 LAB — POCT URINALYSIS DIPSTICK
Bilirubin, UA: NEGATIVE
Blood, UA: NEGATIVE
Glucose, UA: NEGATIVE
Ketones, UA: NEGATIVE
Leukocytes, UA: NEGATIVE
Nitrite, UA: NEGATIVE
Protein, UA: NEGATIVE
Spec Grav, UA: 1.02 (ref 1.010–1.025)
Urobilinogen, UA: 0.2 E.U./dL
pH, UA: 7 (ref 5.0–8.0)

## 2019-12-06 NOTE — Patient Instructions (Addendum)
Gluteus Medius Syndrome  Gluteus medius syndrome causes pain on the outside of your hip due to repeated overstretching or overworking of the gluteus medius muscle. This muscle runs from the top, outer part of your pelvis to the top of your thigh bone (femur). This muscle helps you lift your leg to the side and rotate your leg. It also keeps your hip stable and aligned when you are standing, walking, or running. What are the causes? This condition is caused by small injuries to the gluteus medius muscle over time.  It happens from repetitive movements or a sudden increase in the amount or intensity of activity that involves the legs.  It starts with muscle inflammation and may lead to small tears and scarring in your muscle. What increases the risk? You are more likely to develop this condition if you:  Run on soft or uneven surfaces, such as sand or grass.  Have weakness in your hips and core muscles.  Run long distances.  Increase your time, distance, or intensity of a sport over a short period of time. What are the signs or symptoms? The main symptom of this condition is pain on the outside of your hip with activity.  Usually, the pain will: ? Increase the longer you play sports or run. ? Decrease with rest.  Your hip may also be tender to the touch and you may have muscle spasms. How is this diagnosed? This condition is diagnosed based on your symptoms, medical history, and physical exam.  During the exam, your health care provider will touch different areas of your hip to test for pain.  You may also have imaging studies, such as X-rays and MRI, to rule out other causes of pain. How is this treated? This condition may be treated by a combination of treatments:  Decreasing mileage or time spent doing sports.  Having a coach help you with your running form.  Stretching or strengthening exercises (physical therapy).  Ice or heat therapy.  Massage.  Local electrical  stimulation (transcutaneous electrical nerve stimulation, TENS).  Trigger point injection. A steroid or numbing medicine is injected in the area where you have pain. Follow these instructions at home: Managing pain, stiffness, and swelling      If directed, put ice on the injured area. ? Put ice in a plastic bag. ? Place a towel between your skin and the bag. ? Leave the ice on for 20 minutes, 2-3 times a day.  If directed, apply heat to the affected area before you exercise. Use the heat source that your health care provider recommends, such as a moist heat pack or a heating pad. ? Place a towel between your skin and the heat source. ? Leave the heat on for 20-30 minutes. ? Remove the heat if your skin turns bright red. This is especially important if you are unable to feel pain, heat, or cold. You may have a greater risk of getting burned. Activity  Return to your normal activities as told by your health care provider. Ask your health care provider what activities are safe for you.  Do exercises as told by your physical therapist.  Try not to lie on your painful side. When lying on your other side, put a pillow between your knees to decrease strain on your top hip muscles. General instructions  Take over-the-counter and prescription medicines only as told by your health care provider.  Keep all follow-up visits as told by your health care provider. This is important.  How is this prevented?  Warm up and stretch before being active.  Cool down and stretch after being active.  Give your body time to rest between periods of activity.  Include a variety of exercises and activities in your routine to avoid overuse injuries.  Maintain physical fitness, including: ? Strength. ? Flexibility. ? Cardiovascular fitness. ? Endurance. Contact a health care provider if:  Your pain does not get better or it gets worse. Summary  Gluteus medius syndrome causes pain on the outside of  your hip due to repeated overstretching or overworking of the gluteus medius muscle.  This condition is caused by small injuries to the gluteus medius muscle over time.  Treatments may include physical therapy, massage, and trigger point injection. This information is not intended to replace advice given to you by your health care provider. Make sure you discuss any questions you have with your health care provider. Document Revised: 01/10/2019 Document Reviewed: 02/19/2018 Elsevier Patient Education  [employer]. Alopecia Areata, Adult  Alopecia areata is a condition that causes you to lose hair. You may lose hair on your scalp in patches. In some cases, you may lose all the hair on your scalp (alopecia totalis) or all the hair from your face and body (alopecia universalis). Alopecia areata is an autoimmune disease. This means that your body's defense system (immune system) mistakes normal parts of the body for germs or other things that can make you sick. When you have alopecia areata, the immune system attacks the hair follicles. Alopecia areata usually develops in childhood, but it can develop at any age. For some people, their hair grows back on its own and hair loss does not happen again. For others, their hair may fall out and grow back in cycles. The hair loss may last many years. Having this condition can be emotionally difficult, but it is not dangerous. What are the causes? The cause of this condition is not known. What increases the risk? This condition is more likely to develop in people who have:  A family history of alopecia.  A family history of another autoimmune disease, including type 1 diabetes and rheumatoid arthritis.  Asthma and allergies.  Down syndrome. What are the signs or symptoms? Round spots of patchy hair loss on the scalp is the main symptom of this condition. The spots may be mildly itchy. Other symptoms include:  Short dark hairs in the bald  patches that are wider at the top (exclamation point hairs).  Dents, white spots, or lines in the fingernails or toenails.  Balding and body hair loss. This is rare. How is this diagnosed? This condition is diagnosed based on your symptoms and family history. Your health care provider will also check your scalp skin, teeth, and nails. Your health care provider may refer you to a specialist in hair and skin disorders (dermatologist). You may also have tests, including:  A hair pull test.  Blood tests or other screening tests to check for autoimmune diseases, such as thyroid disease or diabetes.  Skin biopsy to confirm the diagnosis.  A procedure to examine the skin with a lighted magnifying instrument (dermoscopy). How is this treated? There is no cure for alopecia areata. Treatment is aimed at promoting the regrowth of hair and preventing the immune system from overreacting. No single treatment is right for all people with alopecia areata. It depends on the type of hair loss you have and how severe it is. Work with your health care provider to  find the best treatment for you. Treatment may include:  Having regular checkups to make sure the condition is not getting worse (watchful waiting).  Steroid creams or pills for 6-8 weeks to stop the immune reaction and help hair to regrow more quickly.  Other topical medicines to alter the immune system response and support the hair growth cycle.  Steroid injections.  Therapy and counseling with a support group or therapist if you are having trouble coping with hair loss. Follow these instructions at home:  Learn as much as you can about your condition.  Apply topical creams only as told by your health care provider.  Take over-the-counter and prescription medicines only as told by your health care provider.  Consider getting a wig or products to make hair look fuller or to cover bald spots, if you feel uncomfortable with your  appearance.  Get therapy or counseling if you are having a hard time coping with hair loss. Ask your health care provider to recommend a counselor or support group.  Keep all follow-up visits as told by your health care provider. This is important. Contact a health care provider if:  Your hair loss gets worse, even with treatment.  You have new symptoms.  You are struggling emotionally. Summary  Alopecia areata is an autoimmune condition that makes your body's defense system (immune system) attack the hair follicles. This causes you to lose hair.  Treatments may include regular checkups to make sure that the condition is not getting worse (watchful waiting), medicines, and steroid injections. This information is not intended to replace advice given to you by your health care provider. Make sure you discuss any questions you have with your health care provider. Document Revised: 09/01/2017 Document Reviewed: 10/07/2016 Elsevier Patient Education  [employer] or  [employer]  [employer]

## 2019-12-06 NOTE — Progress Notes (Signed)
Established Patient Office Visit  Subjective:  Patient ID: Lauren Petersen, female    DOB: 1966-09-16  Age: 54 y.o. MRN: [id number]  CC:  Chief Complaint  Patient presents with  . Hypertension  . Pain    left side of chest/trunk.   . Alopecia    hair loss   . Epidermal Cyst    on scalp     HPI Brystal Ahmed Trotta presents for follow up. She  has a past medical history of Alopecia, Hyperlipidemia, Hypertension, and Seborrhea capitis in adult (07/2019).   Patient is here for follow-up of elevated blood pressure. She is exercising and is adherent to a low-salt diet. Blood pressure is well controlled at home. She denies cardiac symptoms. Patient denies chest pain, dyspnea, fatigue, irregular heart beat and lower extremity edema. Cardiovascular risk factors: sedentary lifestyle. Use of agents associated with hypertension: oral contraceptives. History of target organ damage: none.  She has left sided abdominal pain 6/10. She describes it as flank pain This has been going on long time. She had images which were normal. She states that the pain has not changed. She does not feel like it was effective. She unable to lay down on her left side. She is unable to sit for long period. She has the pain sometimes. It comes and goes. She states that walking and moving no pain. She has more pain with doing ADL's ironing and washing dishes. She has urinary frequency, occasional incontinences . She denies any dysuria. Her last BM was today but she does battle with constipation.  She denies diarrhea.   She is concern about hair loss. She feels like it is getting worse. She describes it as thinning. She has not tried any treatment.  She noticed a small knot in her head 2 days ago. She admits that all shampoo make her head tender. She denies any redness or itching.    Past Medical History:  Diagnosis Date  . Alopecia   . Hyperlipidemia   . Hypertension   . Seborrhea capitis in adult 07/2019     Past Surgical History:  Procedure Laterality Date  . CESAREAN SECTION      History reviewed. No pertinent family history.  Social History   Socioeconomic History  . Marital status: Married    Spouse name: Not on file  . Number of children: Not on file  . Years of education: Not on file  . Highest education level: Not on file  Occupational History  . Not on file  Tobacco Use  . Smoking status: Never Smoker  . Smokeless tobacco: Never Used  Substance and Sexual Activity  . Alcohol use: No  . Drug use: No  . Sexual activity: Yes    Birth control/protection: I.U.D.  Other Topics Concern  . Not on file  Social History Narrative  . Not on file   Social Determinants of Health   Financial Resource Strain:   . Difficulty of Paying Living Expenses: Not on file  Food Insecurity:   . Worried About Charity fundraiser in the Last Year: Not on file  . Ran Out of Food in the Last Year: Not on file  Transportation Needs:   . Lack of Transportation (Medical): Not on file  . Lack of Transportation (Non-Medical): Not on file  Physical Activity:   . Days of Exercise per Week: Not on file  . Minutes of Exercise per Session: Not on file  Stress:   . Feeling of Stress :  Not on file  Social Connections:   . Frequency of Communication with Friends and Family: Not on file  . Frequency of Social Gatherings with Friends and Family: Not on file  . Attends Religious Services: Not on file  . Active Member of Clubs or Organizations: Not on file  . Attends Archivist Meetings: Not on file  . Marital Status: Not on file  Intimate Partner Violence:   . Fear of Current or Ex-Partner: Not on file  . Emotionally Abused: Not on file  . Physically Abused: Not on file  . Sexually Abused: Not on file    Outpatient Medications Prior to Visit  Medication Sig Dispense Refill  . hydrochlorothiazide (HYDRODIURIL) 25 MG tablet Take 1 tablet (25 mg total) by mouth daily. 90 tablet 3  .  norethindrone (MICRONOR,CAMILA,ERRIN) 0.35 MG tablet Take 1 tablet by mouth daily.    Marland Kitchen gabapentin (NEURONTIN) 300 MG capsule Take 1 capsule (300 mg total) by mouth at bedtime. (Patient not taking: Reported on 10/16/2019) 30 capsule 6  . Norethindrone-Eth Estradiol (VYFEMLA PO) Take 1 tablet by mouth daily.     No facility-administered medications prior to visit.    No Known Allergies  ROS Review of Systems  Constitutional: Negative.   HENT: Negative.   Eyes: Negative.   Respiratory: Negative.   Cardiovascular: Negative.   Gastrointestinal: Negative.   Endocrine: Negative.   Genitourinary: Negative.   Musculoskeletal:       Left leg pain Numbness in the whole leg.    Skin: Negative.   Allergic/Immunologic: Negative.   Neurological: Negative.   Hematological: Negative.   Psychiatric/Behavioral: Negative.       Objective:    Physical Exam  BP 136/66 (BP Location: Right Arm, Patient Position: Sitting, Cuff Size: Normal)   Pulse 63   Temp 98.5 F (36.9 C) (Oral)   Resp 14   Ht 4\' 10"  (1.473 m)   Wt 135 lb (61.2 kg)   LMP 11/27/2019   SpO2 100%   BMI 28.22 kg/m  Wt Readings from Last 3 Encounters:  12/06/19 135 lb (61.2 kg)  07/15/19 134 lb 4.8 oz (60.9 kg)  10/05/18 139 lb (63 kg)     Health Maintenance Due  Topic Date Due  . PAP SMEAR-Modifier  10/17/2016  . COLONOSCOPY  10/03/2017  . INFLUENZA VACCINE  05/04/2019    There are no preventive care reminders to display for this patient.  Lab Results  Component Value Date   TSH 1.170 12/06/2019   Lab Results  Component Value Date   WBC 5.1 12/06/2019   HGB 14.8 12/06/2019   HCT 43.9 12/06/2019   MCV 90 12/06/2019   PLT 259 12/06/2019   Lab Results  Component Value Date   NA 140 12/06/2019   K 3.9 12/06/2019   CO2 24 09/21/2018   GLUCOSE 92 12/06/2019   BUN 12 12/06/2019   CREATININE 0.76 12/06/2019   BILITOT 0.2 12/06/2019   ALKPHOS 62 12/06/2019   AST 17 12/06/2019   ALT 9 12/26/2014    PROT 7.2 12/06/2019   ALBUMIN 4.3 12/06/2019   CALCIUM 10.2 12/06/2019   Lab Results  Component Value Date   CHOL 146 12/06/2019   Lab Results  Component Value Date   HDL 45 12/06/2019   Lab Results  Component Value Date   LDLCALC 79 12/06/2019   Lab Results  Component Value Date   TRIG 126 12/06/2019   Lab Results  Component Value Date   CHOLHDL 3.2  12/06/2019   Lab Results  Component Value Date   HGBA1C 5.7 (H) 12/26/2014      Assessment & Plan:   Problem List Items Addressed This Visit      High   Essential hypertension - Primary   Relevant Orders   Urinalysis Dipstick (Completed)   Comp. Metabolic Panel (12) (Completed)   CBC with Differential/Platelet (Completed)   Vitamin B12 (Completed)     Medium   Alopecia of scalp   Relevant Orders   Vitamin B12 (Completed)    Other Visit Diagnoses    Healthcare maintenance       Relevant Orders   TSH (Completed)   Lipid panel (Completed)   Vitamin D, 25-hydroxy (Completed)   Vitamin B12 (Completed)      No orders of the defined types were placed in this encounter.   Follow-up: Return in about 6 months (around 06/07/2020).    Vevelyn Francois, NP

## 2019-12-07 LAB — CBC WITH DIFFERENTIAL/PLATELET
Basophils Absolute: 0 10*3/uL (ref 0.0–0.2)
Basos: 1 %
EOS (ABSOLUTE): 0.2 10*3/uL (ref 0.0–0.4)
Eos: 4 %
Hematocrit: 43.9 % (ref 34.0–46.6)
Hemoglobin: 14.8 g/dL (ref 11.1–15.9)
Immature Grans (Abs): 0 10*3/uL (ref 0.0–0.1)
Immature Granulocytes: 0 %
Lymphocytes Absolute: 2.2 10*3/uL (ref 0.7–3.1)
Lymphs: 43 %
MCH: 30.4 pg (ref 26.6–33.0)
MCHC: 33.7 g/dL (ref 31.5–35.7)
MCV: 90 fL (ref 79–97)
Monocytes Absolute: 0.4 10*3/uL (ref 0.1–0.9)
Monocytes: 9 %
Neutrophils Absolute: 2.2 10*3/uL (ref 1.4–7.0)
Neutrophils: 43 %
Platelets: 259 10*3/uL (ref 150–450)
RBC: 4.87 x10E6/uL (ref 3.77–5.28)
RDW: 12.8 % (ref 11.7–15.4)
WBC: 5.1 10*3/uL (ref 3.4–10.8)

## 2019-12-07 LAB — COMP. METABOLIC PANEL (12)
AST: 17 IU/L (ref 0–40)
Albumin/Globulin Ratio: 1.5 (ref 1.2–2.2)
Albumin: 4.3 g/dL (ref 3.8–4.9)
Alkaline Phosphatase: 62 IU/L (ref 39–117)
BUN/Creatinine Ratio: 16 (ref 9–23)
BUN: 12 mg/dL (ref 6–24)
Bilirubin Total: 0.2 mg/dL (ref 0.0–1.2)
Calcium: 10.2 mg/dL (ref 8.7–10.2)
Chloride: 102 mmol/L (ref 96–106)
Creatinine, Ser: 0.76 mg/dL (ref 0.57–1.00)
GFR calc Af Amer: 104 mL/min/{1.73_m2} (ref >59)
GFR calc non Af Amer: 90 mL/min/{1.73_m2} (ref >59)
Globulin, Total: 2.9 g/dL (ref 1.5–4.5)
Glucose: 92 mg/dL (ref 65–99)
Potassium: 3.9 mmol/L (ref 3.5–5.2)
Sodium: 140 mmol/L (ref 134–144)
Total Protein: 7.2 g/dL (ref 6.0–8.5)

## 2019-12-07 LAB — VITAMIN B12: Vitamin B-12: 723 pg/mL (ref 232–1245)

## 2019-12-07 LAB — LIPID PANEL
Chol/HDL Ratio: 3.2 ratio (ref 0.0–4.4)
Cholesterol, Total: 146 mg/dL (ref 100–199)
HDL: 45 mg/dL (ref >39)
LDL Chol Calc (NIH): 79 mg/dL (ref 0–99)
Triglycerides: 126 mg/dL (ref 0–149)
VLDL Cholesterol Cal: 22 mg/dL (ref 5–40)

## 2019-12-07 LAB — TSH: TSH: 1.17 u[IU]/mL (ref 0.450–4.500)

## 2019-12-07 LAB — VITAMIN D 25 HYDROXY (VIT D DEFICIENCY, FRACTURES): Vit D, 25-Hydroxy: 26.8 ng/mL — ABNORMAL LOW (ref 30.0–100.0)

## 2019-12-09 ENCOUNTER — Encounter: Payer: Self-pay | Admitting: Nurse Practitioner

## 2019-12-09 ENCOUNTER — Telehealth: Payer: Self-pay

## 2019-12-09 DIAGNOSIS — E559 Vitamin D deficiency, unspecified: Secondary | ICD-10-CM | POA: Insufficient documentation

## 2019-12-09 NOTE — Telephone Encounter (Signed)
-----   Message from Vevelyn Francois, NP sent at 12/09/2019  9:03 AM EST ----- All labs are within normal range.  She does have vitamin D insufficiency this can be corrected by using over-the-counter vitamin D I think I told her to start 2000 units/day please confirm with her and encouraged her to take this otherwise labs look great.

## 2019-12-09 NOTE — Telephone Encounter (Signed)
Called using language resources ID [phone]. No answer. Message was left for patient to call back. Thanks!

## 2019-12-10 NOTE — Telephone Encounter (Signed)
Called and spoke with patient using Language Resources [phone]. Advised patient that labs were normal with the exception of vitamin D level. Asked that she take otc vitamin D 2000 units once daily. Patient verbalized understanding and was asked to keep next appointment.

## 2019-12-17 DIAGNOSIS — H04123 Dry eye syndrome of bilateral lacrimal glands: Secondary | ICD-10-CM | POA: Diagnosis not present

## 2020-05-20 ENCOUNTER — Other Ambulatory Visit: Payer: Self-pay | Admitting: Nurse Practitioner

## 2020-05-20 MED ORDER — SELENIUM SULFIDE 2.5 % EX LOTN: TOPICAL_LOTION | Freq: Every day | CUTANEOUS | 11 refills | Status: DC | PRN

## 2020-06-10 ENCOUNTER — Encounter: Payer: Self-pay | Admitting: Nurse Practitioner

## 2020-06-10 ENCOUNTER — Other Ambulatory Visit: Payer: Self-pay

## 2020-06-10 ENCOUNTER — Ambulatory Visit (INDEPENDENT_AMBULATORY_CARE_PROVIDER_SITE_OTHER): Payer: Medicaid Other | Admitting: Nurse Practitioner

## 2020-06-10 VITALS — BP 125/71 | HR 64 | Temp 97.2°F | Ht <= 58 in | Wt 136.8 lb

## 2020-06-10 DIAGNOSIS — M545 Low back pain: Secondary | ICD-10-CM

## 2020-06-10 DIAGNOSIS — I10 Essential (primary) hypertension: Secondary | ICD-10-CM | POA: Diagnosis not present

## 2020-06-10 DIAGNOSIS — Z1159 Encounter for screening for other viral diseases: Secondary | ICD-10-CM | POA: Diagnosis not present

## 2020-06-10 DIAGNOSIS — G8929 Other chronic pain: Secondary | ICD-10-CM | POA: Diagnosis not present

## 2020-06-10 DIAGNOSIS — E559 Vitamin D deficiency, unspecified: Secondary | ICD-10-CM | POA: Diagnosis not present

## 2020-06-10 DIAGNOSIS — L659 Nonscarring hair loss, unspecified: Secondary | ICD-10-CM

## 2020-06-10 MED ORDER — SELENIUM SULFIDE 2.5 % EX LOTN: TOPICAL_LOTION | Freq: Every day | CUTANEOUS | 11 refills | Status: DC | PRN

## 2020-06-10 NOTE — Progress Notes (Signed)
[place],   [id number] Phone:  [phone]   Fax:  [phone]   Established Patient Office Visit  Subjective:  Patient ID: Lauren Petersen, female    DOB: 02/10/1966  Age: 54 y.o. MRN: [id number]  CC:  Chief Complaint  Patient presents with   Follow-up    hair is still coming out, shampoo helped some    HPI [facility] presents for follow up. She  has a past medical history of Alopecia, Hyperlipidemia, Hypertension, and Seborrhea capitis in adult (07/2019).   She has occasional back pain. She   Hypertension Patient is here for follow-up of elevated blood pressure. She is exercising and is adherent to a low-salt diet. Blood pressure is well controlled at home. Cardiac symptoms: none. Patient denies chest pain, exertional chest pressure/discomfort, fatigue, irregular heart beat, lower extremity edema and palpitations. Cardiovascular risk factors: hypertension. Use of agents associated with hypertension: none. History of target organ damage: none.   Past Medical History:  Diagnosis Date   Alopecia    Hyperlipidemia    Hypertension    Seborrhea capitis in adult 07/2019    Past Surgical History:  Procedure Laterality Date   CESAREAN SECTION      No family history on file.  Social History   Socioeconomic History   Marital status: Married    Spouse name: Not on file   Number of children: Not on file   Years of education: Not on file   Highest education level: Not on file  Occupational History   Not on file  Tobacco Use   Smoking status: Never Smoker   Smokeless tobacco: Never Used  Substance and Sexual Activity   Alcohol use: No   Drug use: No   Sexual activity: Yes    Birth control/protection: I.U.D.  Other Topics Concern   Not on file  Social History Narrative   Not on file   Social Determinants of Health   Financial Resource Strain:    Difficulty of Paying Living  Expenses: Not on file  Food Insecurity:    Worried About [place] in the Last Year: Not on file   Ran Out of Food in the Last Year: Not on file  Transportation Needs:    Lack of Transportation (Medical): Not on file   Lack of Transportation (Non-Medical): Not on file  Physical Activity:    Days of Exercise per Week: Not on file   Minutes of Exercise per Session: Not on file  Stress:    Feeling of Stress : Not on file  Social Connections:    Frequency of Communication with Friends and Family: Not on file   Frequency of Social Gatherings with Friends and Family: Not on file   Attends Religious Services: Not on file   Active Member of Clubs or Organizations: Not on file   Attends Archivist Meetings: Not on file   Marital Status: Not on file  Intimate Partner Violence:    Fear of Current or Ex-Partner: Not on file   Emotionally Abused: Not on file   Physically Abused: Not on file   Sexually Abused: Not on file    Outpatient Medications Prior to Visit  Medication Sig Dispense Refill   hydrochlorothiazide (HYDRODIURIL) 25 MG tablet Take 1 tablet (25 mg total) by mouth daily. 90 tablet 3   norethindrone (MICRONOR,CAMILA,ERRIN) 0.35 MG tablet Take 1 tablet by mouth daily.     Norethindrone-Eth Estradiol (  VYFEMLA PO) Take 1 tablet by mouth daily.     gabapentin (NEURONTIN) 300 MG capsule Take 1 capsule (300 mg total) by mouth at bedtime. 30 capsule 6   selenium sulfide (SELSUN) 2.5 % shampoo Apply topically daily as needed. 118 mL 11   No facility-administered medications prior to visit.    No Known Allergies  ROS Review of Systems  Constitutional: Negative for chills and fever.  HENT: Negative.   Eyes: Negative.   Respiratory: Negative.   Cardiovascular: Negative.   Gastrointestinal: Negative.   Endocrine: Negative.   Genitourinary: Negative.   Musculoskeletal: Positive for back pain (occasional resolves with home remedies).    Allergic/Immunologic: Negative.   Neurological: Positive for headaches (occasional). Negative for dizziness.  Hematological: Negative.   Psychiatric/Behavioral: Negative.       Objective:    Physical Exam Constitutional:      General: She is not in acute distress.    Appearance: She is normal weight. She is not ill-appearing, toxic-appearing or diaphoretic.  HENT:     Head: Normocephalic and atraumatic.     Nose: Nose normal.     Mouth/Throat:     Mouth: Mucous membranes are moist.  Musculoskeletal:        General: Normal range of motion.     Cervical back: Normal range of motion.  Skin:    General: Skin is warm and dry.     Capillary Refill: Capillary refill takes less than 2 seconds.  Neurological:     General: No focal deficit present.     Mental Status: She is alert and oriented to person, place, and time.  Psychiatric:        Mood and Affect: Mood normal.        Behavior: Behavior normal.        Thought Content: Thought content normal.        Judgment: Judgment normal.     BP 125/71    Pulse 64    Temp (!) 97.2 F (36.2 C) (Temporal)    Ht 4\' 10"  (1.473 m)    Wt 136 lb 12.8 oz (62.1 kg)    SpO2 100%    BMI 28.59 kg/m  Wt Readings from Last 3 Encounters:  06/10/20 136 lb 12.8 oz (62.1 kg)  12/06/19 135 lb (61.2 kg)  07/15/19 134 lb 4.8 oz (60.9 kg)     Health Maintenance Due  Topic Date Due   INFLUENZA VACCINE  05/03/2020    There are no preventive care reminders to display for this patient.  Lab Results  Component Value Date   TSH 1.170 12/06/2019   Lab Results  Component Value Date   WBC 5.1 12/06/2019   HGB 14.8 12/06/2019   HCT 43.9 12/06/2019   MCV 90 12/06/2019   PLT 259 12/06/2019   Lab Results  Component Value Date   NA 140 12/06/2019   K 3.9 12/06/2019   CO2 24 09/21/2018   GLUCOSE 92 12/06/2019   BUN 12 12/06/2019   CREATININE 0.76 12/06/2019   BILITOT 0.2 12/06/2019   ALKPHOS 62 12/06/2019   AST 17 12/06/2019   ALT 9  12/26/2014   PROT 7.2 12/06/2019   ALBUMIN 4.3 12/06/2019   CALCIUM 10.2 12/06/2019   Lab Results  Component Value Date   CHOL 146 12/06/2019   Lab Results  Component Value Date   HDL 45 12/06/2019   Lab Results  Component Value Date   LDLCALC 79 12/06/2019   Lab Results  Component Value  Date   TRIG 126 12/06/2019   Lab Results  Component Value Date   CHOLHDL 3.2 12/06/2019   Lab Results  Component Value Date   HGBA1C 5.7 (H) 12/26/2014      Assessment & Plan:   Problem List Items Addressed This Visit      Cardiovascular and Mediastinum   Essential hypertension Encouraged on going compliance with current medication regimen Encouraged home monitoring and recording BP <130/80 Eating a heart-healthy diet with less salt Encouraged regular physical activity  Recommend Weight loss    Relevant Orders   Comp. Metabolic Panel (12)     Musculoskeletal and Integument   Alopecia of scalp   Relevant Orders   Ambulatory referral to Dermatology     Other   Vitamin D insufficiency Continue with OTC Vitamin D3   Low back pain without sciatica    Other Visit Diagnoses    Encounter for hepatitis C screening test for low risk patient       Relevant Orders   Hepatitis C antibody      Meds ordered this encounter  Medications   selenium sulfide (SELSUN) 2.5 % shampoo    Sig: Apply topically daily as needed.    Dispense:  118 mL    Refill:  11    Order Specific Question:   Supervising Provider    Answer:   Tresa Garter [[id number]]    Follow-up: Return in about 6 months (around 12/08/2020).    Lauren Francois, NP

## 2020-06-10 NOTE — Patient Instructions (Signed)
Alopecia Areata, Adult  Alopecia areata is a condition that causes you to lose hair. You may lose hair on your scalp in patches. In some cases, you may lose all the hair on your scalp (alopecia totalis) or all the hair from your face and body (alopecia universalis). Alopecia areata is an autoimmune disease. This means that your body's defense system (immune system) mistakes normal parts of the body for germs or other things that can make you sick. When you have alopecia areata, the immune system attacks the hair follicles. Alopecia areata usually develops in childhood, but it can develop at any age. For some people, their hair grows back on its own and hair loss does not happen again. For others, their hair may fall out and grow back in cycles. The hair loss may last many years. Having this condition can be emotionally difficult, but it is not dangerous. What are the causes? The cause of this condition is not known. What increases the risk? This condition is more likely to develop in people who have:  A family history of alopecia.  A family history of another autoimmune disease, including type 1 diabetes and rheumatoid arthritis.  Asthma and allergies.  Down syndrome. What are the signs or symptoms? Round spots of patchy hair loss on the scalp is the main symptom of this condition. The spots may be mildly itchy. Other symptoms include:  Short dark hairs in the bald patches that are wider at the top (exclamation point hairs).  Dents, white spots, or lines in the fingernails or toenails.  Balding and body hair loss. This is rare. How is this diagnosed? This condition is diagnosed based on your symptoms and family history. Your health care provider will also check your scalp skin, teeth, and nails. Your health care provider may refer you to a specialist in hair and skin disorders (dermatologist). You may also have tests, including:  A hair pull test.  Blood tests or other screening tests  to check for autoimmune diseases, such as thyroid disease or diabetes.  Skin biopsy to confirm the diagnosis.  A procedure to examine the skin with a lighted magnifying instrument (dermoscopy). How is this treated? There is no cure for alopecia areata. Treatment is aimed at promoting the regrowth of hair and preventing the immune system from overreacting. No single treatment is right for all people with alopecia areata. It depends on the type of hair loss you have and how severe it is. Work with your health care provider to find the best treatment for you. Treatment may include:  Having regular checkups to make sure the condition is not getting worse (watchful waiting).  Steroid creams or pills for 6-8 weeks to stop the immune reaction and help hair to regrow more quickly.  Other topical medicines to alter the immune system response and support the hair growth cycle.  Steroid injections.  Therapy and counseling with a support group or therapist if you are having trouble coping with hair loss. Follow these instructions at home:  Learn as much as you can about your condition.  Apply topical creams only as told by your health care provider.  Take over-the-counter and prescription medicines only as told by your health care provider.  Consider getting a wig or products to make hair look fuller or to cover bald spots, if you feel uncomfortable with your appearance.  Get therapy or counseling if you are having a hard time coping with hair loss. Ask your health care provider to recommend   a counselor or support group.  Keep all follow-up visits as told by your health care provider. This is important. Contact a health care provider if:  Your hair loss gets worse, even with treatment.  You have new symptoms.  You are struggling emotionally. Summary  Alopecia areata is an autoimmune condition that makes your body's defense system (immune system) attack the hair follicles. This causes you  to lose hair.  Treatments may include regular checkups to make sure that the condition is not getting worse (watchful waiting), medicines, and steroid injections. This information is not intended to replace advice given to you by your health care provider. Make sure you discuss any questions you have with your health care provider. Document Revised: 09/01/2017 Document Reviewed: 10/07/2016 Elsevier Patient Education  2020 [employer]  

## 2020-06-11 LAB — COMP. METABOLIC PANEL (12)
AST: 13 IU/L (ref 0–40)
Albumin/Globulin Ratio: 1.4 (ref 1.2–2.2)
Albumin: 4.3 g/dL (ref 3.8–4.9)
Alkaline Phosphatase: 63 IU/L (ref 48–121)
BUN/Creatinine Ratio: 14 (ref 9–23)
BUN: 11 mg/dL (ref 6–24)
Bilirubin Total: 0.2 mg/dL (ref 0.0–1.2)
Calcium: 9.8 mg/dL (ref 8.7–10.2)
Chloride: 101 mmol/L (ref 96–106)
Creatinine, Ser: 0.8 mg/dL (ref 0.57–1.00)
GFR calc Af Amer: 98 mL/min/{1.73_m2} (ref >59)
GFR calc non Af Amer: 85 mL/min/{1.73_m2} (ref >59)
Globulin, Total: 3.1 g/dL (ref 1.5–4.5)
Glucose: 89 mg/dL (ref 65–99)
Potassium: 3.6 mmol/L (ref 3.5–5.2)
Sodium: 141 mmol/L (ref 134–144)
Total Protein: 7.4 g/dL (ref 6.0–8.5)

## 2020-06-11 LAB — HEPATITIS C ANTIBODY: Hep C Virus Ab: <0.1 s/co ratio (ref 0.0–0.9)

## 2020-06-16 DIAGNOSIS — N959 Unspecified menopausal and perimenopausal disorder: Secondary | ICD-10-CM | POA: Diagnosis not present

## 2020-06-16 DIAGNOSIS — Z113 Encounter for screening for infections with a predominantly sexual mode of transmission: Secondary | ICD-10-CM | POA: Diagnosis not present

## 2020-06-16 DIAGNOSIS — Z3041 Encounter for surveillance of contraceptive pills: Secondary | ICD-10-CM | POA: Diagnosis not present

## 2020-06-16 DIAGNOSIS — Z3202 Encounter for pregnancy test, result negative: Secondary | ICD-10-CM | POA: Diagnosis not present

## 2020-06-22 ENCOUNTER — Encounter: Payer: Self-pay | Admitting: Nurse Practitioner

## 2020-06-22 ENCOUNTER — Ambulatory Visit (INDEPENDENT_AMBULATORY_CARE_PROVIDER_SITE_OTHER): Payer: Medicaid Other | Admitting: Nurse Practitioner

## 2020-06-22 ENCOUNTER — Other Ambulatory Visit: Payer: Self-pay

## 2020-06-22 VITALS — BP 131/66 | HR 70 | Temp 97.3°F | Ht 59.0 in | Wt 136.4 lb

## 2020-06-22 DIAGNOSIS — N951 Menopausal and female climacteric states: Secondary | ICD-10-CM

## 2020-06-22 NOTE — Patient Instructions (Signed)

## 2020-06-22 NOTE — Progress Notes (Signed)
[place], [place]  [address] Phone:  [phone]   Fax:  [phone]   Established Patient Office Visit  Subjective:  Patient ID: Lauren Petersen, female    DOB: 1966/06/02  Age: 54 y.o. MRN: [id number]  CC:  Chief Complaint  Patient presents with  . Follow-up    HPI Lauren Petersen presents for She had spotting for seven days.  She had normal   Irregular Menstruation Patient complains of irregular menses. No LMP recorded. Menarche age: unknown. Periods are regular every 28-30 days, lasting 7 days. Dysmenorrhea:none. Cyclic symptoms include: none. Current contraception: OCP (estrogen/progesterone). History of infertility: no. History of abnormal Pap smear: no. She is today because her last cycle was only spotting for 7 days. She was seen a few weeks ago but called for labs. She has a gyn apt scheduled in October for full well woman exam.   Past Medical History:  Diagnosis Date  . Alopecia   . Hyperlipidemia   . Hypertension   . Seborrhea capitis in adult 07/2019    Past Surgical History:  Procedure Laterality Date  . CESAREAN SECTION      History reviewed. No pertinent family history.  Social History   Socioeconomic History  . Marital status: Married    Spouse name: Not on file  . Number of children: Not on file  . Years of education: Not on file  . Highest education level: Not on file  Occupational History  . Not on file  Tobacco Use  . Smoking status: Never Smoker  . Smokeless tobacco: Never Used  Substance and Sexual Activity  . Alcohol use: No  . Drug use: No  . Sexual activity: Yes    Birth control/protection: I.U.D.  Other Topics Concern  . Not on file  Social History Narrative  . Not on file   Social Determinants of Health   Financial Resource Strain:   . Difficulty of Paying Living Expenses: Not on file  Food Insecurity:   . Worried About Charity fundraiser in the Last Year: Not on file    . Ran Out of Food in the Last Year: Not on file  Transportation Needs:   . Lack of Transportation (Medical): Not on file  . Lack of Transportation (Non-Medical): Not on file  Physical Activity:   . Days of Exercise per Week: Not on file  . Minutes of Exercise per Session: Not on file  Stress:   . Feeling of Stress : Not on file  Social Connections:   . Frequency of Communication with Friends and Family: Not on file  . Frequency of Social Gatherings with Friends and Family: Not on file  . Attends Religious Services: Not on file  . Active Member of Clubs or Organizations: Not on file  . Attends Archivist Meetings: Not on file  . Marital Status: Not on file  Intimate Partner Violence:   . Fear of Current or Ex-Partner: Not on file  . Emotionally Abused: Not on file  . Physically Abused: Not on file  . Sexually Abused: Not on file    Outpatient Medications Prior to Visit  Medication Sig Dispense Refill  . Biotin 1000 MCG tablet Take 1,000 mcg by mouth 3 (three) times daily.    . cholecalciferol (VITAMIN D3) 25 MCG (1000 UNIT) tablet Take 1,000 Units by mouth daily.    . hydrochlorothiazide (HYDRODIURIL) 25 MG tablet Take 1 tablet (25 mg total) by  mouth daily. 90 tablet 3  . norethindrone (MICRONOR,CAMILA,ERRIN) 0.35 MG tablet Take 1 tablet by mouth daily.    Danelle Earthly Estradiol (VYFEMLA PO) Take 1 tablet by mouth daily.    Marland Kitchen selenium sulfide (SELSUN) 2.5 % shampoo Apply topically daily as needed. 118 mL 11   No facility-administered medications prior to visit.    No Known Allergies  ROS Review of Systems  Genitourinary: Positive for vaginal bleeding.  All other systems reviewed and are negative.     Objective:    Physical Exam Constitutional:      General: She is not in acute distress.    Appearance: She is normal weight.  HENT:     Head: Normocephalic and atraumatic.     Nose: Nose normal.     Mouth/Throat:     Mouth: Mucous membranes are  moist.  Pulmonary:     Effort: Pulmonary effort is normal.  Musculoskeletal:        General: Normal range of motion.     Cervical back: Normal range of motion.  Skin:    General: Skin is warm and dry.     Capillary Refill: Capillary refill takes less than 2 seconds.  Neurological:     General: No focal deficit present.     Mental Status: She is alert and oriented to person, place, and time.  Psychiatric:        Mood and Affect: Mood normal.        Behavior: Behavior normal.        Thought Content: Thought content normal.        Judgment: Judgment normal.     BP 131/66   Pulse 70   Temp (!) 97.3 F (36.3 C) (Temporal)   Ht 4\' 11"  (1.499 m)   Wt 136 lb 6.4 oz (61.9 kg)   SpO2 100%   BMI 27.55 kg/m  Wt Readings from Last 3 Encounters:  06/22/20 136 lb 6.4 oz (61.9 kg)  06/10/20 136 lb 12.8 oz (62.1 kg)  12/06/19 135 lb (61.2 kg)     Health Maintenance Due  Topic Date Due  . INFLUENZA VACCINE  05/03/2020    There are no preventive care reminders to display for this patient.  Lab Results  Component Value Date   TSH 1.170 12/06/2019   Lab Results  Component Value Date   WBC 5.1 12/06/2019   HGB 14.8 12/06/2019   HCT 43.9 12/06/2019   MCV 90 12/06/2019   PLT 259 12/06/2019   Lab Results  Component Value Date   NA 141 06/10/2020   K 3.6 06/10/2020   CO2 24 09/21/2018   GLUCOSE 89 06/10/2020   BUN 11 06/10/2020   CREATININE 0.80 06/10/2020   BILITOT 0.2 06/10/2020   ALKPHOS 63 06/10/2020   AST 13 06/10/2020   ALT 9 12/26/2014   PROT 7.4 06/10/2020   ALBUMIN 4.3 06/10/2020   CALCIUM 9.8 06/10/2020   Lab Results  Component Value Date   CHOL 146 12/06/2019   Lab Results  Component Value Date   HDL 45 12/06/2019   Lab Results  Component Value Date   LDLCALC 79 12/06/2019   Lab Results  Component Value Date   TRIG 126 12/06/2019   Lab Results  Component Value Date   CHOLHDL 3.2 12/06/2019   Lab Results  Component Value Date   HGBA1C 5.7  (H) 12/26/2014      Assessment & Plan:   Problem List Items Addressed This Visit    None  Visit Diagnoses    Perimenopausal symptoms    -  Primary Hormone levels pending we will call patient with interpreter for results. Encourage patient to have complete physical as scheduled in October and make sure that she emphasizes her symptoms at that visit.   Relevant Orders   Hormone Panel      No orders of the defined types were placed in this encounter.   Follow-up: Return for Appointment As Scheduled.    Vevelyn Francois, NP

## 2020-07-01 LAB — HORMONE PANEL (T4,TSH,FSH,TESTT,SHBG,DHEA,ETC)
DHEA-Sulfate, LCMS: 129 ug/dL
Estradiol, Serum, MS: 92 pg/mL
Estrone Sulfate: 85 ng/dL
Follicle Stimulating Hormone: 4.1 m[IU]/mL
Free T-3: 2.9 pg/mL
Free Testosterone, Serum: 1.6 pg/mL
Progesterone, Serum: 706 ng/dL
Sex Hormone Binding Globulin: 38.2 nmol/L
T4: 5.9 ug/dL
TSH: 3.4 uU/mL
Testosterone, Serum (Total): 16 ng/dL
Testosterone-% Free: 1 %
Triiodothyronine (T-3), Serum: 102 ng/dL

## 2020-09-09 DIAGNOSIS — L648 Other androgenic alopecia: Secondary | ICD-10-CM | POA: Diagnosis not present

## 2020-09-09 DIAGNOSIS — L218 Other seborrheic dermatitis: Secondary | ICD-10-CM | POA: Diagnosis not present

## 2020-09-27 DIAGNOSIS — H5213 Myopia, bilateral: Secondary | ICD-10-CM | POA: Diagnosis not present

## 2020-10-06 DIAGNOSIS — Z3200 Encounter for pregnancy test, result unknown: Secondary | ICD-10-CM | POA: Diagnosis not present

## 2020-10-06 DIAGNOSIS — Z01419 Encounter for gynecological examination (general) (routine) without abnormal findings: Secondary | ICD-10-CM | POA: Diagnosis not present

## 2020-10-06 DIAGNOSIS — N959 Unspecified menopausal and perimenopausal disorder: Secondary | ICD-10-CM | POA: Diagnosis not present

## 2020-10-06 DIAGNOSIS — Z113 Encounter for screening for infections with a predominantly sexual mode of transmission: Secondary | ICD-10-CM | POA: Diagnosis not present

## 2020-10-06 DIAGNOSIS — Z3041 Encounter for surveillance of contraceptive pills: Secondary | ICD-10-CM | POA: Diagnosis not present

## 2020-11-06 ENCOUNTER — Other Ambulatory Visit: Payer: Self-pay | Admitting: Nurse Practitioner

## 2020-11-06 DIAGNOSIS — Z1231 Encounter for screening mammogram for malignant neoplasm of breast: Secondary | ICD-10-CM

## 2020-11-20 ENCOUNTER — Other Ambulatory Visit: Payer: Self-pay | Admitting: Nurse Practitioner

## 2020-11-20 DIAGNOSIS — I10 Essential (primary) hypertension: Secondary | ICD-10-CM

## 2020-11-23 NOTE — Telephone Encounter (Signed)
Please see refill request.

## 2020-11-25 ENCOUNTER — Telehealth: Payer: Self-pay

## 2020-11-25 NOTE — Telephone Encounter (Signed)
sent 

## 2020-11-25 NOTE — Telephone Encounter (Signed)
Med refill on hycloridine 25mg 

## 2020-11-26 NOTE — Telephone Encounter (Signed)
Called and spoke with son  to inform patient that his mother  medication has been sent to the pharmacy on 11/25/20.

## 2020-11-26 NOTE — Telephone Encounter (Signed)
Son called checking status of refill request for hydrochlorothiazide. Lauren Petersen, Lauren Petersen [phone]

## 2020-12-08 ENCOUNTER — Other Ambulatory Visit: Payer: Self-pay

## 2020-12-08 ENCOUNTER — Ambulatory Visit
Admission: RE | Admit: 2020-12-08 | Discharge: 2020-12-08 | Disposition: A | Discharge status: Home / Self Care | Payer: Medicaid Other | Source: Ambulatory Visit | Attending: Nurse Practitioner | Admitting: Nurse Practitioner

## 2020-12-08 DIAGNOSIS — Z1231 Encounter for screening mammogram for malignant neoplasm of breast: Secondary | ICD-10-CM

## 2020-12-08 DIAGNOSIS — L648 Other androgenic alopecia: Secondary | ICD-10-CM | POA: Diagnosis not present

## 2020-12-09 ENCOUNTER — Encounter: Payer: Self-pay | Admitting: Nurse Practitioner

## 2020-12-09 ENCOUNTER — Ambulatory Visit: Payer: Medicaid Other | Admitting: Nurse Practitioner

## 2020-12-09 VITALS — BP 136/69 | HR 63 | Ht 59.0 in | Wt 139.0 lb

## 2020-12-09 DIAGNOSIS — G629 Polyneuropathy, unspecified: Secondary | ICD-10-CM | POA: Diagnosis not present

## 2020-12-09 DIAGNOSIS — R6882 Decreased libido: Secondary | ICD-10-CM

## 2020-12-09 MED ORDER — SILDENAFIL CITRATE 20 MG PO TABS: 20.0000 mg | ORAL_TABLET | Freq: Three times a day (TID) | ORAL | 0 refills | Status: DC

## 2020-12-09 NOTE — Progress Notes (Signed)
Lauren Petersen, [place]  [address] Phone:  [phone]   Fax:  [phone]   Established Patient Office Visit  Subjective:  Patient ID: Lauren Petersen, female    DOB: Apr 25, 1966  Age: 55 y.o. MRN: [id number]  CC:  Chief Complaint  Patient presents with  . Follow-up  . Numbness  . Sexual Problem    HPI   Lauren Petersen is an 55 y.o. female who presents with numbness and varicose veins that began several months ago. Pain is located in left greater than right The pain is described as intermittent, numbing and tingling.  Associated symptoms include: none. The patient has tried nothing for pain relief. Related to injury: no. Worse with standing and walking   She is having problems with her sex drive/libido. She has noticed about 3 months. She is married. She was seen at gynecology and was referred back to her primary care provider. She admits that she is Muslim and it that her duty is to satisfy her husband. She is concern that he may leave her and marry another and she does not satisfy his desire.  She admits that her last menstrual cycle was 07/05/20. She does have vaginal dryness.  She denies any pain with intercourse.  Past Medical History:  Diagnosis Date  . Alopecia   . Hyperlipidemia   . Hypertension   . Seborrhea capitis in adult 07/2019    Past Surgical History:  Procedure Laterality Date  . CESAREAN SECTION      No family history on file.  Social History   Socioeconomic History  . Marital status: Married    Spouse name: Not on file  . Number of children: Not on file  . Years of education: Not on file  . Highest education level: Not on file  Occupational History  . Not on file  Tobacco Use  . Smoking status: Never Smoker  . Smokeless tobacco: Never Used  Substance and Sexual Activity  . Alcohol use: No  . Drug use: No  . Sexual activity: Yes    Birth control/protection: I.U.D.  Other Topics Concern  .  Not on file  Social History Narrative  . Not on file   Social Determinants of Health   Financial Resource Strain: Not on file  Food Insecurity: Not on file  Transportation Needs: Not on file  Physical Activity: Not on file  Stress: Not on file  Social Connections: Not on file  Intimate Partner Violence: Not on file    Outpatient Medications Prior to Visit  Medication Sig Dispense Refill  . Biotin 1000 MCG tablet Take 1,000 mcg by mouth 3 (three) times daily.    . cholecalciferol (VITAMIN D3) 25 MCG (1000 UNIT) tablet Take 1,000 Units by mouth daily.    . hydrochlorothiazide (HYDRODIURIL) 25 MG tablet TAKE 1 TABLET(25 MG) BY MOUTH DAILY 90 tablet 0  . norethindrone (MICRONOR,CAMILA,ERRIN) 0.35 MG tablet Take 1 tablet by mouth daily.    Marland Kitchen selenium sulfide (SELSUN) 2.5 % shampoo Apply topically daily as needed. 118 mL 11  . Norethindrone-Eth Estradiol (VYFEMLA PO) Take 1 tablet by mouth daily.     No facility-administered medications prior to visit.    No Known Allergies  ROS Review of Systems    Objective:    Physical Exam HENT:     Head: Normocephalic and atraumatic.  Cardiovascular:     Rate and Rhythm: Normal rate and regular rhythm.     Pulses:  Normal pulses.     Heart sounds: Normal heart sounds.  Pulmonary:     Effort: Pulmonary effort is normal.     Breath sounds: Normal breath sounds.  Musculoskeletal:        General: Normal range of motion.     Cervical back: Normal range of motion.  Skin:    General: Skin is warm and dry.     Capillary Refill: Capillary refill takes less than 2 seconds.     Comments: Increased facial hair  Neurological:     General: No focal deficit present.     Mental Status: She is alert and oriented to person, place, and time.  Psychiatric:        Mood and Affect: Mood normal.        Behavior: Behavior normal.        Thought Content: Thought content normal.        Judgment: Judgment normal.     BP 136/69   Pulse 63   Ht 4'  11" (1.499 m)   Wt 139 lb (63 kg)   SpO2 100%   BMI 28.07 kg/m  Wt Readings from Last 3 Encounters:  12/09/20 139 lb (63 kg)  06/22/20 136 lb 6.4 oz (61.9 kg)  06/10/20 136 lb 12.8 oz (62.1 kg)     Health Maintenance Due  Topic Date Due  . PAP SMEAR-Modifier  10/17/2016    There are no preventive care reminders to display for this patient.  Lab Results  Component Value Date   TSH 1.170 12/06/2019   Lab Results  Component Value Date   WBC 5.1 12/06/2019   HGB 14.8 12/06/2019   HCT 43.9 12/06/2019   MCV 90 12/06/2019   PLT 259 12/06/2019   Lab Results  Component Value Date   NA 141 06/10/2020   K 3.6 06/10/2020   CO2 24 09/21/2018   GLUCOSE 89 06/10/2020   BUN 11 06/10/2020   CREATININE 0.80 06/10/2020   BILITOT 0.2 06/10/2020   ALKPHOS 63 06/10/2020   AST 13 06/10/2020   ALT 9 12/26/2014   PROT 7.4 06/10/2020   ALBUMIN 4.3 06/10/2020   CALCIUM 9.8 06/10/2020   Lab Results  Component Value Date   CHOL 146 12/06/2019   Lab Results  Component Value Date   HDL 45 12/06/2019   Lab Results  Component Value Date   LDLCALC 79 12/06/2019   Lab Results  Component Value Date   TRIG 126 12/06/2019   Lab Results  Component Value Date   CHOLHDL 3.2 12/06/2019   Lab Results  Component Value Date   HGBA1C 5.7 (H) 12/26/2014      Assessment & Plan:   Problem List Items Addressed This Visit      Nervous and Auditory   Neuropathy Persistent patient would like regular treatment.  However due to her concern about the low libido gabapentin has been held until she is able to see how the sildenafil will work    Other Visit Diagnoses    Low libido    -  Primary Worsening trial sildenafil 20 mg covered by insurance Hormone levels pending   Relevant Orders   Hormone Panel      Meds ordered this encounter  Medications  . sildenafil (REVATIO) 20 MG tablet    Sig: Take 1 tablet (20 mg total) by mouth 3 (three) times daily.    Dispense:  10 tablet     Refill:  0    Order Specific Question:  Supervising Provider    Answer:   Tresa Garter [[id number]]    Follow-up: Return in about 4 weeks (around 01/06/2021) for follow up medication management [address].    Vevelyn Francois, NP

## 2020-12-09 NOTE — Patient Instructions (Addendum)
Varicose Veins Varicose veins are veins that have become enlarged, bulged, and twisted. They most often appear in the legs. What are the causes? This condition is caused by damage to the valves in the vein. These valves help blood return to your heart. When they are damaged and they stop working properly, blood may flow backward and back up in the veins near the skin, causing the veins to get larger and appear twisted. The condition can result from any issue that causes blood to back up, like pregnancy, prolonged standing, or obesity. What increases the risk? This condition is more likely to develop in people who are:  On their feet a lot.  Pregnant.  Overweight. What are the signs or symptoms? Symptoms of this condition include:  Bulging, twisted, and bluish veins.  A feeling of heaviness. This may be worse at the end of the day.  Leg pain. This may be worse at the end of the day.  Swelling in the leg.  Changes in skin color over the veins. How is this diagnosed? This condition may be diagnosed based on your symptoms, a physical exam, and an ultrasound test. How is this treated? Treatment for this condition may involve:  Avoiding sitting or standing in one position for long periods of time.  Wearing compression stockings. These stockings help to prevent blood clots and reduce swelling in the legs.  Raising (elevating) the legs when resting.  Losing weight.  Exercising regularly. If you have persistent symptoms or want to improve the way your varicose veins look, you may choose to have a procedure to close the varicose veins off or to remove them. Treatments to close off the veins include:  Sclerotherapy. In this treatment, a solution is injected into a vein to close it off.  Laser treatment. In this treatment, the vein is heated with a laser to close it off.  Radiofrequency vein ablation. In this treatment, an electrical current produced by radio waves is used to close  off the vein. Treatments to remove the veins include:  Phlebectomy. In this treatment, the veins are removed through small incisions made over the veins.  Vein ligation and stripping. In this treatment, incisions are made over the veins. The veins are then removed after being tied (ligated) with stitches (sutures). Follow these instructions at home: Activity  Walk as much as possible. Walking increases blood flow. This helps blood return to the heart and takes pressure off your veins. It also increases your cardiovascular strength.  Follow your health care provider's instructions about exercising.  Do not stand or sit in one position for a long period of time.  Do not sit with your legs crossed.  Rest with your legs raised during the day. General instructions  Follow any diet instructions given to you by your health care provider.  Wear compression stockings as directed by your health care provider. Do not wear other kinds of tight clothing around your legs, pelvis, or waist.  Elevate your legs at night to above the level of your heart.  If you get a cut in the skin over the varicose vein and the vein bleeds: ? Lie down with your leg raised. ? Apply firm pressure to the cut with a clean cloth until the bleeding stops. ? Place a bandage (dressing) on the cut.   Contact a health care provider if:  The skin around your varicose veins starts to break down.  You have pain, redness, tenderness, or hard swelling over a vein.    You are uncomfortable because of pain.  You get a cut in the skin over a varicose vein and it will not stop bleeding. Summary  Varicose veins are veins that have become enlarged, bulged, and twisted. They most often appear in the legs.  This condition is caused by damage to the valves in the vein. These valves help blood return to your heart.  Treatment for this condition includes frequent movements, wearing compression stockings, losing weight, and  exercising regularly. In some cases, procedures are done to close off or remove the veins.  Treatment for this condition may include wearing compression stockings, elevating the legs, losing weight, and engaging in regular activity. In some cases, procedures are done to close off or remove the veins. This information is not intended to replace advice given to you by your health care provider. Make sure you discuss any questions you have with your health care provider. Document Revised: 01/30/2020 Document Reviewed: 01/30/2020 Elsevier Patient Education  [place].

## 2020-12-24 LAB — HORMONE PANEL (T4,TSH,FSH,TESTT,SHBG,DHEA,ETC)
DHEA-Sulfate, LCMS: 132 ug/dL
Estradiol, Serum, MS: 7.5 pg/mL
Estrone Sulfate: 19 ng/dL
Follicle Stimulating Hormone: 62 m[IU]/mL
Free T-3: 3 pg/mL
Free Testosterone, Serum: 3.3 pg/mL
Progesterone, Serum: <10 ng/dL
Sex Hormone Binding Globulin: 30.3 nmol/L
T4: 7.3 ug/dL
TSH: 1.8 uU/mL
Testosterone, Serum (Total): 22 ng/dL
Testosterone-% Free: 1.5 %
Triiodothyronine (T-3), Serum: 115 ng/dL

## 2020-12-28 ENCOUNTER — Telehealth: Payer: Self-pay

## 2020-12-28 NOTE — Telephone Encounter (Signed)
Arabic interpreter used: patient informed of results.

## 2020-12-28 NOTE — Telephone Encounter (Signed)
-----   Message from Vevelyn Francois, NP sent at 12/24/2020  8:21 AM EDT ----- Hormone panel normal

## 2021-01-06 ENCOUNTER — Ambulatory Visit: Payer: Medicaid Other | Admitting: Nurse Practitioner

## 2021-01-18 ENCOUNTER — Ambulatory Visit (INDEPENDENT_AMBULATORY_CARE_PROVIDER_SITE_OTHER): Payer: Medicaid Other | Admitting: Family Medicine

## 2021-01-18 ENCOUNTER — Other Ambulatory Visit: Payer: Self-pay

## 2021-01-18 ENCOUNTER — Encounter: Payer: Self-pay | Admitting: Family Medicine

## 2021-01-18 VITALS — BP 147/80 | HR 62 | Wt 137.6 lb

## 2021-01-18 DIAGNOSIS — N393 Stress incontinence (female) (male): Secondary | ICD-10-CM | POA: Diagnosis not present

## 2021-01-18 DIAGNOSIS — N3941 Urge incontinence: Secondary | ICD-10-CM | POA: Diagnosis not present

## 2021-01-18 MED ORDER — OXYBUTYNIN CHLORIDE ER 10 MG PO TB24: 10.0000 mg | ORAL_TABLET | Freq: Every day | ORAL | 6 refills | Status: DC

## 2021-01-18 NOTE — Progress Notes (Signed)
   GYNECOLOGY PROBLEM  VISIT ENCOUNTER NOTE  Subjective:   Lauren Petersen is a 55 y.o. [phone] female here for a a problem GYN visit.    Current complaints: .   Urinary incontinence- 1 year in duration. Reports having leaking urine with cough/sneeze and also when she has to go she will leak/have accident.  Report some increased urgency and then small volumes.   Pain with sex- longstanding. Only with the first five minutes. Same throughout this time. Worse when she is tired., when she is "really tired"  Denies abnormal vaginal bleeding, discharge, pelvic pain, problems with intercourse or other gynecologic concerns.    Gynecologic History Patient's last menstrual period was 07/05/2020 (exact date). Contraception: none  Health Maintenance Due  Topic Date Due  . PAP SMEAR-Modifier  10/17/2016     The following portions of the patient's history were reviewed and updated as appropriate: allergies, current medications, past family history, past medical history, past social history, past surgical history and problem list.  Review of Systems Pertinent items are noted in HPI.   Objective:  BP (!) 147/80   Pulse 62   Wt 137 lb 9.6 oz (62.4 kg)   LMP 07/05/2020 (Exact Date)   BMI 27.79 kg/m  Gen: well appearing, NAD HEENT: no scleral icterus CV: RR Lung: Normal WOB Ext: warm well perfused  PELVIC: Normal appearing external genitalia; normal appearing vaginal mucosa and cervix.  No abnormal discharge noted.  Normal uterine size, no other palpable masses, no uterine or adnexal tenderness. Abel to contract circumferentially on exam. Not prolapse noted, uterus does not move with internal exam/valsalva. No gross cysto or rectocele.    Assessment and Plan:  1. Stress incontinence Reviewed PT and patient ammenable - Ambulatory referral to Physical Therapy  2. Urge incontinence There is a component of urge/overactive bladder. Discussed medication trial - oxybutynin (DITROPAN XL)  10 MG 24 hr tablet; Take 1 tablet (10 mg total) by mouth at bedtime.  Dispense: 30 tablet; Refill: 6 - Ambulatory referral to Physical Therapy  3. Perimenopausal - reviewed that if no menses for 1 year she is considered to have gone through menopause  Please refer to After Visit Summary for other counseling recommendations.   Return if symptoms worsen or fail to improve.  Caren Macadam, MD, MPH, ABFM Attending [place] for [facility] ([place])

## 2021-02-08 ENCOUNTER — Encounter: Payer: Self-pay | Admitting: Nurse Practitioner

## 2021-02-08 ENCOUNTER — Ambulatory Visit (INDEPENDENT_AMBULATORY_CARE_PROVIDER_SITE_OTHER): Payer: Medicaid Other | Admitting: Nurse Practitioner

## 2021-02-08 ENCOUNTER — Other Ambulatory Visit: Payer: Self-pay

## 2021-02-08 ENCOUNTER — Ambulatory Visit: Payer: Medicaid Other | Admitting: Nurse Practitioner

## 2021-02-08 VITALS — BP 126/65 | HR 59 | Ht 59.0 in | Wt 135.0 lb

## 2021-02-08 DIAGNOSIS — I10 Essential (primary) hypertension: Secondary | ICD-10-CM | POA: Diagnosis not present

## 2021-02-08 DIAGNOSIS — R6882 Decreased libido: Secondary | ICD-10-CM | POA: Diagnosis not present

## 2021-02-08 MED ORDER — HYDROCHLOROTHIAZIDE 25 MG PO TABS
25.0000 mg | ORAL_TABLET | Freq: Every day | ORAL | 3 refills | Status: DC
Start: 2021-02-08 — End: 2022-02-16

## 2021-02-08 MED ORDER — SILDENAFIL CITRATE 20 MG PO TABS: 20.0000 mg | ORAL_TABLET | Freq: Three times a day (TID) | ORAL | 5 refills | Status: DC

## 2021-02-08 NOTE — Progress Notes (Addendum)
[place], [place]  [address] Phone:  [phone]   Fax:  [phone]   Established Patient Office Visit  Subjective:  Patient ID: Lauren Petersen, female    DOB: [birth year]  Age: 55 y.o. MRN: [id number]  CC:  Chief Complaint  Patient presents with  . Follow-up    Medication follow up, stated medication given at last OV is working for her.     HPI Lauren Petersen presents for follow up. She  has a past medical history of Alopecia, Hyperlipidemia, Hypertension, and Seborrhea capitis in adult (07/2019).   She was prescribed the sidenafil 20 mg. She is taking this at least 2 times per week. She does not feel like she has any negative side effectives. She reports needing a refill.  Past Medical History:  Diagnosis Date  . Alopecia   . Hyperlipidemia   . Hypertension   . Seborrhea capitis in adult 07/2019    Past Surgical History:  Procedure Laterality Date  . CESAREAN SECTION      History reviewed. No pertinent family history.  Social History   Socioeconomic History  . Marital status: Married    Spouse name: Not on file  . Number of children: Not on file  . Years of education: Not on file  . Highest education level: Not on file  Occupational History  . Not on file  Tobacco Use  . Smoking status: Never Smoker  . Smokeless tobacco: Never Used  Vaping Use  . Vaping Use: Never used  Substance and Sexual Activity  . Alcohol use: No  . Drug use: No  . Sexual activity: Yes    Birth control/protection: I.U.D.  Other Topics Concern  . Not on file  Social History Narrative  . Not on file   Social Determinants of Health   Financial Resource Strain: Not on file  Food Insecurity: No Food Insecurity  . Worried About Charity fundraiser in the Last Year: Never true  . Ran Out of Food in the Last Year: Never true  Transportation Needs: No Transportation Needs  . Lack of Transportation (Medical): No  . Lack of  Transportation (Non-Medical): No  Physical Activity: Not on file  Stress: Not on file  Social Connections: Not on file  Intimate Partner Violence: Not on file    Outpatient Medications Prior to Visit  Medication Sig Dispense Refill  . Biotin 1000 MCG tablet Take 1,000 mcg by mouth 3 (three) times daily.    . cholecalciferol (VITAMIN D3) 25 MCG (1000 UNIT) tablet Take 1,000 Units by mouth daily.    . DERMA-SMOOTHE/FS SCALP 0.01 % OIL APPLY A SMALL AMOUNT TO AFFECTED AREA ON SCALP EVERY NIGHT    . Multiple Vitamin (MULTIVITAMIN PO) Take by mouth.    . norethindrone (MICRONOR,CAMILA,ERRIN) 0.35 MG tablet Take 1 tablet by mouth daily.    Marland Kitchen oxybutynin (DITROPAN XL) 10 MG 24 hr tablet Take 1 tablet (10 mg total) by mouth at bedtime. 30 tablet 6  . selenium sulfide (SELSUN) 2.5 % shampoo Apply topically daily as needed. 118 mL 11  . hydrochlorothiazide (HYDRODIURIL) 25 MG tablet TAKE 1 TABLET(25 MG) BY MOUTH DAILY 90 tablet 0  . sildenafil (REVATIO) 20 MG tablet Take 1 tablet (20 mg total) by mouth 3 (three) times daily. (Patient not taking: No sig reported) 10 tablet 0   No facility-administered medications prior to visit.    No Known Allergies  ROS Review of Systems  Objective:    Physical Exam Constitutional:      General: She is not in acute distress.    Appearance: She is not ill-appearing or toxic-appearing.  HENT:     Head: Normocephalic and atraumatic.     Nose: Nose normal.  Cardiovascular:     Rate and Rhythm: Normal rate and regular rhythm.     Pulses: Normal pulses.     Heart sounds: Normal heart sounds.  Pulmonary:     Effort: Pulmonary effort is normal.     Breath sounds: Normal breath sounds.  Abdominal:     Palpations: Abdomen is soft.  Musculoskeletal:        General: Normal range of motion.     Cervical back: Normal range of motion.  Skin:    General: Skin is warm and dry.     Capillary Refill: Capillary refill takes less than 2 seconds.  Neurological:      General: No focal deficit present.     Mental Status: She is alert and oriented to person, place, and time.  Psychiatric:        Mood and Affect: Mood normal.        Behavior: Behavior normal.        Thought Content: Thought content normal.        Judgment: Judgment normal.     BP 126/65 (BP Location: Left Arm, Patient Position: Sitting, Cuff Size: Small)   Pulse (!) 59   Ht 4\' 11"  (1.499 m)   Wt 135 lb 0.4 oz (61.2 kg)   SpO2 100%   BMI 27.27 kg/m  Wt Readings from Last 3 Encounters:  02/08/21 135 lb 0.4 oz (61.2 kg)  01/18/21 137 lb 9.6 oz (62.4 kg)  12/09/20 139 lb (63 kg)     Health Maintenance Due  Topic Date Due  . PAP SMEAR-Modifier  10/17/2016    There are no preventive care reminders to display for this patient.  Lab Results  Component Value Date   TSH 1.170 12/06/2019   Lab Results  Component Value Date   WBC 5.1 12/06/2019   HGB 14.8 12/06/2019   HCT 43.9 12/06/2019   MCV 90 12/06/2019   PLT 259 12/06/2019   Lab Results  Component Value Date   NA 141 06/10/2020   K 3.6 06/10/2020   CO2 24 09/21/2018   GLUCOSE 89 06/10/2020   BUN 11 06/10/2020   CREATININE 0.80 06/10/2020   BILITOT 0.2 06/10/2020   ALKPHOS 63 06/10/2020   AST 13 06/10/2020   ALT 9 12/26/2014   PROT 7.4 06/10/2020   ALBUMIN 4.3 06/10/2020   CALCIUM 9.8 06/10/2020   Lab Results  Component Value Date   CHOL 146 12/06/2019   Lab Results  Component Value Date   HDL 45 12/06/2019   Lab Results  Component Value Date   LDLCALC 79 12/06/2019   Lab Results  Component Value Date   TRIG 126 12/06/2019   Lab Results  Component Value Date   CHOLHDL 3.2 12/06/2019   Lab Results  Component Value Date   HGBA1C 5.7 (H) 12/26/2014      Assessment & Plan:   Problem List Items Addressed This Visit      Cardiovascular and Mediastinum   Essential hypertension Encouraged on going compliance with current medication regimen Encouraged home monitoring and recording BP  <130/80 Eating a heart-healthy diet with less salt Encouraged regular physical activity  Recommend Weight loss   Relevant Medications   sildenafil (REVATIO) 20 MG  tablet   hydrochlorothiazide (HYDRODIURIL) 25 MG tablet   Other Relevant Orders   Comp. Metabolic Panel (12)    Other Visit Diagnoses    Low libido    -  Primary  Will continue with current dose      Meds ordered this encounter  Medications  . sildenafil (REVATIO) 20 MG tablet    Sig: Take 1 tablet (20 mg total) by mouth 3 (three) times daily.    Dispense:  10 tablet    Refill:  5    Order Specific Question:   Supervising Provider    Answer:   Tresa Garter [id number]  . hydrochlorothiazide (HYDRODIURIL) 25 MG tablet    Sig: Take 1 tablet (25 mg total) by mouth daily.    Dispense:  90 tablet    Refill:  3    Order Specific Question:   Supervising Provider    Answer:   Tresa Garter [id number]    Follow-up: Return in about 3 months (around 05/11/2021) for Follow up HTN [id number].    Vevelyn Francois, NP

## 2021-02-08 NOTE — Patient Instructions (Signed)
Managing Your Hypertension Hypertension, also called high blood pressure, is when the force of the blood pressing against the walls of the arteries is too strong. Arteries are blood vessels that carry blood from your heart throughout your body. Hypertension forces the heart to work harder to pump blood and may cause the arteries to become narrow or stiff. Understanding blood pressure readings Your personal target blood pressure may vary depending on your medical conditions, your age, and other factors. A blood pressure reading includes a higher number over a lower number. Ideally, your blood pressure should be below 120/80. You should know that:  The first, or top, number is called the systolic pressure. It is a measure of the pressure in your arteries as your heart beats.  The second, or bottom number, is called the diastolic pressure. It is a measure of the pressure in your arteries as the heart relaxes. Blood pressure is classified into four stages. Based on your blood pressure reading, your health care provider may use the following stages to determine what type of treatment you need, if any. Systolic pressure and diastolic pressure are measured in a unit called mmHg. Normal  Systolic pressure: below 120.  Diastolic pressure: below 80. Elevated  Systolic pressure: 120-129.  Diastolic pressure: below 80. Hypertension stage 1  Systolic pressure: 130-139.  Diastolic pressure: 80-89. Hypertension stage 2  Systolic pressure: 140 or above.  Diastolic pressure: 90 or above. How can this condition affect me? Managing your hypertension is an important responsibility. Over time, hypertension can damage the arteries and decrease blood flow to important parts of the body, including the brain, heart, and kidneys. Having untreated or uncontrolled hypertension can lead to:  A heart attack.  A stroke.  A weakened blood vessel (aneurysm).  Heart failure.  Kidney damage.  Eye  damage.  Metabolic syndrome.  Memory and concentration problems.  Vascular dementia. What actions can I take to manage this condition? Hypertension can be managed by making lifestyle changes and possibly by taking medicines. Your health care provider will help you make a plan to bring your blood pressure within a normal range. Nutrition  Eat a diet that is high in fiber and potassium, and low in salt (sodium), added sugar, and fat. An example eating plan is called the Dietary Approaches to Stop Hypertension (DASH) diet. To eat this way: ? Eat plenty of fresh fruits and vegetables. Try to fill one-half of your plate at each meal with fruits and vegetables. ? Eat whole grains, such as whole-wheat pasta, brown rice, or whole-grain bread. Fill about one-fourth of your plate with whole grains. ? Eat low-fat dairy products. ? Avoid fatty cuts of meat, processed or cured meats, and poultry with skin. Fill about one-fourth of your plate with lean proteins such as fish, chicken without skin, beans, eggs, and tofu. ? Avoid pre-made and processed foods. These tend to be higher in sodium, added sugar, and fat.  Reduce your daily sodium intake. Most people with hypertension should eat less than 1,500 mg of sodium a day.   Lifestyle  Work with your health care provider to maintain a healthy body weight or to lose weight. Ask what an ideal weight is for you.  Get at least 30 minutes of exercise that causes your heart to beat faster (aerobic exercise) most days of the week. Activities may include walking, swimming, or biking.  Include exercise to strengthen your muscles (resistance exercise), such as weight lifting, as part of your weekly exercise routine. Try   to do these types of exercises for 30 minutes at least 3 days a week.  Do not use any products that contain nicotine or tobacco, such as cigarettes, e-cigarettes, and chewing tobacco. If you need help quitting, ask your health care  provider.  Control any long-term (chronic) conditions you have, such as high cholesterol or diabetes.  Identify your sources of stress and find ways to manage stress. This may include meditation, deep breathing, or making time for fun activities.   Alcohol use  Do not drink alcohol if: ? Your health care provider tells you not to drink. ? You are pregnant, may be pregnant, or are planning to become pregnant.  If you drink alcohol: ? Limit how much you use to:  0-1 drink a day for women.  0-2 drinks a day for men. ? Be aware of how much alcohol is in your drink. In the [place] one drink equals one 12 oz bottle of beer (355 mL), one 5 oz glass of wine (148 mL), or one 1 oz glass of hard liquor (44 mL). Medicines Your health care provider may prescribe medicine if lifestyle changes are not enough to get your blood pressure under control and if:  Your systolic blood pressure is 130 or higher.  Your diastolic blood pressure is 80 or higher. Take medicines only as told by your health care provider. Follow the directions carefully. Blood pressure medicines must be taken as told by your health care provider. The medicine does not work as well when you skip doses. Skipping doses also puts you at risk for problems. Monitoring Before you monitor your blood pressure:  Do not smoke, drink caffeinated beverages, or exercise within 30 minutes before taking a measurement.  Use the bathroom and empty your bladder (urinate).  Sit quietly for at least 5 minutes before taking measurements. Monitor your blood pressure at home as told by your health care provider. To do this:  Sit with your back straight and supported.  Place your feet flat on the floor. Do not cross your legs.  Support your arm on a flat surface, such as a table. Make sure your upper arm is at heart level.  Each time you measure, take two or three readings one minute apart and record the results. You may also need to have your  blood pressure checked regularly by your health care provider.   General information  Talk with your health care provider about your diet, exercise habits, and other lifestyle factors that may be contributing to hypertension.  Review all the medicines you take with your health care provider because there may be side effects or interactions.  Keep all visits as told by your health care provider. Your health care provider can help you create and adjust your plan for managing your high blood pressure. Where to find more information  National Heart, Lung, and Blood Institute: www.nhlbi.nih.gov  American Heart Association: www.heart.org Contact a health care provider if:  You think you are having a reaction to medicines you have taken.  You have repeated (recurrent) headaches.  You feel dizzy.  You have swelling in your ankles.  You have trouble with your vision. Get help right away if:  You develop a severe headache or confusion.  You have unusual weakness or numbness, or you feel faint.  You have severe pain in your chest or abdomen.  You vomit repeatedly.  You have trouble breathing. These symptoms may represent a serious problem that is an emergency. Do not wait   to see if the symptoms will go away. Get medical help right away. Call your local emergency services (911 in the [place] Do not drive yourself to the hospital. Summary  Hypertension is when the force of blood pumping through your arteries is too strong. If this condition is not controlled, it may put you at risk for serious complications.  Your personal target blood pressure may vary depending on your medical conditions, your age, and other factors. For most people, a normal blood pressure is less than 120/80.  Hypertension is managed by lifestyle changes, medicines, or both.  Lifestyle changes to help manage hypertension include losing weight, eating a healthy, low-sodium diet, exercising more, stopping smoking, and  limiting alcohol. This information is not intended to replace advice given to you by your health care provider. Make sure you discuss any questions you have with your health care provider. Document Revised: 10/25/2019 Document Reviewed: 08/20/2019 Elsevier Patient Education  2021 [employer]  

## 2021-02-09 LAB — COMP. METABOLIC PANEL (12)
AST: 31 IU/L (ref 0–40)
Albumin/Globulin Ratio: 1.6 (ref 1.2–2.2)
Albumin: 4.5 g/dL (ref 3.8–4.9)
Alkaline Phosphatase: 66 IU/L (ref 44–121)
BUN/Creatinine Ratio: 11 (ref 9–23)
BUN: 10 mg/dL (ref 6–24)
Bilirubin Total: 0.3 mg/dL (ref 0.0–1.2)
Calcium: 9.7 mg/dL (ref 8.7–10.2)
Chloride: 96 mmol/L (ref 96–106)
Creatinine, Ser: 0.89 mg/dL (ref 0.57–1.00)
Globulin, Total: 2.9 g/dL (ref 1.5–4.5)
Glucose: 149 mg/dL — ABNORMAL HIGH (ref 65–99)
Potassium: 2.9 mmol/L — ABNORMAL LOW (ref 3.5–5.2)
Sodium: 138 mmol/L (ref 134–144)
Total Protein: 7.4 g/dL (ref 6.0–8.5)
eGFR: 77 mL/min/{1.73_m2} (ref >59)

## 2021-02-11 ENCOUNTER — Other Ambulatory Visit: Payer: Self-pay | Admitting: Nurse Practitioner

## 2021-02-11 DIAGNOSIS — E876 Hypokalemia: Secondary | ICD-10-CM

## 2021-02-11 MED ORDER — POTASSIUM CHLORIDE CRYS ER 10 MEQ PO TBCR: 10.0000 meq | EXTENDED_RELEASE_TABLET | Freq: Every day | ORAL | 0 refills | Status: DC

## 2021-02-22 DIAGNOSIS — Z20822 Contact with and (suspected) exposure to covid-19: Secondary | ICD-10-CM | POA: Diagnosis not present

## 2021-03-30 DIAGNOSIS — L648 Other androgenic alopecia: Secondary | ICD-10-CM | POA: Diagnosis not present

## 2021-04-06 ENCOUNTER — Encounter: Payer: Medicaid Other | Attending: Family Medicine | Admitting: Physical Therapy

## 2021-04-06 ENCOUNTER — Other Ambulatory Visit: Payer: Self-pay

## 2021-04-06 DIAGNOSIS — N3941 Urge incontinence: Secondary | ICD-10-CM | POA: Insufficient documentation

## 2021-04-06 DIAGNOSIS — M6281 Muscle weakness (generalized): Secondary | ICD-10-CM | POA: Diagnosis not present

## 2021-04-06 DIAGNOSIS — R278 Other lack of coordination: Secondary | ICD-10-CM | POA: Insufficient documentation

## 2021-04-06 NOTE — Patient Instructions (Signed)
3 times a week you will massage the perineal body for 3 minutes  Then place your finger in the vagina Tighten the vagina and feel the muscles contract for 3 to 5 seconds  Earlie Counts, PT [facility] [address] [place], [place] [address] W: [phone] [email or web address]

## 2021-04-06 NOTE — Therapy (Signed)
[place] [facility] [address] [place], [place], [address] Phone: [phone]   Fax:  [phone]  Physical Therapy Evaluation  Patient Details  Name: Lauren Petersen MRN: [id number] Date of Birth: [birth year] Referring Provider (PT): Dr. Caren Macadam   Encounter Date: 04/06/2021   PT End of Session - 04/06/21 1031     Visit Number 1    Date for PT Re-Evaluation 08/07/21    Authorization Type [employer]    PT Start Time 0930    PT Stop Time 1025    PT Time Calculation (min) 55 min    Activity Tolerance Patient tolerated treatment well    Behavior During Therapy [facility] for tasks assessed/performed             Past Medical History:  Diagnosis Date   Alopecia    Hyperlipidemia    Hypertension    Seborrhea capitis in adult 07/2019    Past Surgical History:  Procedure Laterality Date   CESAREAN SECTION      There were no vitals filed for this visit.    Subjective Assessment - 04/06/21 0941     Subjective Patient reports issues for 8 months. When drink water after 10 minutes she feels the urge to urinate and will leak before she sits on the commode. Patient reports the medication is helping  a little bit her reach the bathroom. Now is not drinking much water.    Patient is accompained by: Interpreter    Patient Stated Goals reduce leakage    Currently in Pain? No/denies                [facility] PT Assessment - 04/06/21 0001       Assessment   Medical Diagnosis N39.3 Stress incontinence; N39.41 Urge incontinence    Referring Provider (PT) Dr. Caren Macadam    Onset Date/Surgical Date --   8 months ago   Prior Therapy not for pelvic floor      Precautions   Precautions None      Restrictions   Weight Bearing Restrictions No      Balance Screen   Has the patient fallen in the past 6 months No    Has the patient had a decrease in activity level because of a fear of  falling?  No    Is the patient reluctant to leave their home because of a fear of falling?  No      Home Ecologist residence      Prior Function   Level of Independence Independent    Leisure no      Cognition   Overall Cognitive Status Within Functional Limits for tasks assessed      Observation/Other Assessments   Skin Integrity thickness on the distal half of the c-section scar      Posture/Postural Control   Posture/Postural Control No significant limitations      ROM / Strength   AROM / PROM / Strength AROM;PROM;Strength      AROM   Overall AROM Comments lumbar ROM is full      PROM   Right Hip External Rotation  45    Left Hip External Rotation  45      Strength   Right Hip Extension 4/5    Left Hip Extension 4/5      Palpation   Palpation comment Decreased movement of the lower rib cage  Objective measurements completed on examination: See above findings.     Pelvic Floor Special Questions - 04/06/21 0001     Prior Pregnancies Yes    Number of Pregnancies 5    Number of C-Sections 1    Number of Vaginal Deliveries 4    Diastasis Recti none    Currently Sexually Active No    Is this Painful No    Urinary Leakage Yes    Pad use 0    Activities that cause leaking With strong urge;Coughing;Sneezing    Urinary urgency Yes    Fecal incontinence No    Falling out feeling (prolapse) Yes    Activities that cause feeling of prolapse none and MD confirmed it    Skin Integrity Intact    Scar Well healed;Restricted;Episiotomy    Perineal Body/Introitus  Descended    External Palpation thickness at the perineal body    Prolapse Uterine;Posterior Wall    Pelvic Floor Internal Exam Patient confirms identifitcaion and approves PT to assess pelvic floor and treatment    Exam Type Vaginal    Palpation tightness around the introitus    Strength weak squeeze, no lift    Strength # of seconds 1                       PT Education - 04/06/21 1029     Education Details educated patient on how to feel the pelvic floor contraction with inserting her finger in the vaginal canal, how to perform perineal massage    Person(s) Educated Patient;Other (comment)   interpreter   Methods Explanation;Demonstration;Handout    Comprehension Returned demonstration;Verbalized understanding              PT Short Term Goals - 04/06/21 1323       PT SHORT TERM GOAL #1   Title Patient will be independent with HEP     Baseline not educated yet    Time 4    Period Weeks    Status New    Target Date 05/04/21      PT SHORT TERM GOAL #2   Title understand what bladder irritants are and how they affect the bladder    Baseline not educated yet    Time 4    Period Weeks    Status New    Target Date 05/04/21      PT SHORT TERM GOAL #3   Title ---    Baseline --               PT Long Term Goals - 04/06/21 1347       PT LONG TERM GOAL #1   Title Independent with advanced HEP for pelvic floor and core strength    Baseline not educated yet    Time 4    Period Months    Status New    Target Date 08/07/21      PT LONG TERM GOAL #2   Title able to walk to the bathroom without leaking urine due to improved strength of pelvic floor  >/= 3/5 strength holding for 10 seconds    Baseline strength is 3/5 holding 1 sec ; will  leak as she is walking to the commode    Time 4    Period Months    Status New    Target Date 08/07/21      PT LONG TERM GOAL #3   Title understand the urge to void to suppress the need to  urinate quickly but to walk slowly to the commode    Baseline not educated yet    Time 4    Period Weeks    Status New    Target Date 08/07/21      PT LONG TERM GOAL #4   Title minimal leakage with laughing and coughing due to improved pelvic floor contraction quickly and not hold her breath    Baseline holds her breath with a pelvic floor contraction    Time  4    Period Months    Status New    Target Date 08/07/21                    Plan - 04/06/21 1032     Clinical Impression Statement Patient is a 55 year old female with stress and urge incontinence. She has had this for 8 months. She does not wear a pad. Patient will leak urine with coughing and sneezing and as she is walking the commode. She has a uterine prolapse and posterior wall weakness. he posterior wall prolapse is below the introitus the uterine prolapse is down a little bit. Her pelvic floor contraction is 2/5 for 1 second. She has tightness in the sides of the introitus. when she contracts she will raise her rib cage and hold her breath. She has decreased movement of the lower rib cage. Weakness in bilateral hip extension and abdominal strength. she has  tightness in the lower 1/3 of her c-section scar and in the perineal body where she has had an episiotomy. Patient will benefit from skilled therapy to improve pelvic floor coordination and strength to reduce her leakage.    Personal Factors and Comorbidities Time since onset of injury/illness/exacerbation;Age;Fitness;Comorbidity 1    Comorbidities c section x1 and  episiotomy x4    Examination-Activity Limitations Continence;Toileting;Locomotion Level    Examination-Participation Restrictions Community Activity    Stability/Clinical Decision Making Stable/Uncomplicated    Clinical Decision Making Low    Rehab Potential Excellent    PT Frequency 1x / week    PT Duration Other (comment)   4 months   PT Treatment/Interventions ADLs/Self Care Home Management;Biofeedback;Therapeutic activities;Therapeutic exercise;Neuromuscular re-education;Patient/family education;Manual techniques;Dry needling    PT Next Visit Plan bladder irritants; manual work ont he perineal body and vaginal opening; ways to manage cystocele; abdominal contraction; pelvic floor contraction without compensation    Consulted and Agree with Plan of Care  Patient;Other (Comment)   interpreter            Patient will benefit from skilled therapeutic intervention in order to improve the following deficits and impairments:  Decreased endurance, Decreased scar mobility, Decreased activity tolerance, Decreased strength, Increased fascial restricitons, Increased muscle spasms, Decreased coordination  Visit Diagnosis: Muscle weakness (generalized) - Plan: PT plan of care cert/re-cert  Other lack of coordination - Plan: PT plan of care cert/re-cert  Urge incontinence - Plan: PT plan of care cert/re-cert     Problem List Patient Active Problem List   Diagnosis Date Noted   Vitamin D insufficiency 12/09/2019   Left sided abdominal pain 07/15/2019   Seborrhea capitis in adult 07/15/2019   Alopecia of scalp 07/15/2019   Neuropathy 07/15/2019   Essential hypertension 07/15/2019   Hyperlipidemia 07/15/2019   Language barrier to communication 09/21/2018   Pain in left foot 02/15/2017   Low back pain without sciatica 02/15/2017   Calculus of gallbladder and bile duct w/o cholecystitis or obstruction 04/14/2014   Flatulence 09/11/2013   GERD (gastroesophageal reflux  disease) 09/11/2013   Pain due to dental caries 07/18/2013   Acute pharyngitis 04/02/2013   Sinusitis, acute 04/02/2013   Streptococcal sore throat 04/02/2013   Other malaise and fatigue 04/02/2013    Earlie Counts, PT 04/06/21 2:10 PM  [facility] [address] [place], [place], [address] Phone: [phone]   Fax:  [phone]  Name: Lauren Petersen MRN: [id number] Date of Birth: Feb 19, 1966

## 2021-04-13 ENCOUNTER — Other Ambulatory Visit: Payer: Self-pay

## 2021-04-13 ENCOUNTER — Encounter: Payer: Self-pay | Admitting: Physical Therapy

## 2021-04-13 ENCOUNTER — Encounter: Payer: Medicaid Other | Admitting: Physical Therapy

## 2021-04-13 DIAGNOSIS — M6281 Muscle weakness (generalized): Secondary | ICD-10-CM

## 2021-04-13 DIAGNOSIS — R278 Other lack of coordination: Secondary | ICD-10-CM

## 2021-04-13 DIAGNOSIS — N3941 Urge incontinence: Secondary | ICD-10-CM | POA: Diagnosis not present

## 2021-04-13 NOTE — Patient Instructions (Addendum)
Bladder Irritants  Certain foods and beverages can be irritating to the bladder.  Avoiding these irritants may decrease your symptoms of urinary urgency, frequency or bladder pain.  Even reducing your intake can help with your symptoms.  Not everyone is sensitive to all bladder irritants, so you may consider focusing on one irritant at a time, removing or reducing your intake of that irritant for 7-10 days to see if this change helps your symptoms.  Water intake is also very important.  Below is a list of bladder irritants.  Drinks: alcohol, carbonated beverages, caffeinated beverages such as coffee and tea, drinks with artificial sweeteners, citrus juices, apple juice, tomato juice  Foods: tomatoes and tomato based foods, spicy food, sugar and artificial sweeteners, vinegar, chocolate, raw onion, apples, citrus fruits, pineapple, cranberries, tomatoes, strawberries, plums, peaches, cantaloupe  Other: acidic urine (too concentrated) - see water intake info below  Substitutes you can try that are NOT irritating to the bladder: cooked onion, pears, papayas, sun-brewed decaf teas, watermelons, non-citrus herbal teas, apricots, kava and low-acid instant drinks (Postum).    WATER INTAKE: Remember to drink lots of water (aim for fluid intake of half your body weight with 2/3 of fluids being water).  You may be limiting fluids due to fear of leakage, but this can actually worsen urgency symptoms due to highly concentrated urine.  Water helps balance the pH of your urine so it doesn't become too acidic - acidic urine is a bladder irritant!  Access Code: [facility] URL: [email or web address] Date: 04/13/2021 Prepared by: Earlie Counts  Exercises Diaphragmatic Breathing at 90/90 Supported - 1 x daily - 7 x weekly - 1 sets - 10 reps Supine Hip Adduction Isometric with Ball - 1 x daily - 7 x weekly - 1 sets - 10 reps - 5 sec hold Supine Bridge with Mini Swiss Ball Between Knees - 1 x daily -  7 x weekly - 1 sets - 15 reps  Earlie Counts, PT [facility] [address] [place], [place] [address] W: [phone] [email or web address]

## 2021-04-13 NOTE — Therapy (Signed)
[place] at [facility] [address], [place], [place], [address] Phone: [phone]   Fax:  [phone]  Physical Therapy Treatment  Patient Details  Name: Lauren Petersen MRN: [id number] Date of Birth: 1966/08/17 Referring Provider (PT): Dr. Caren Macadam   Encounter Date: 04/13/2021   PT End of Session - 04/13/21 1013     Visit Number 2    Date for PT Re-Evaluation 08/07/21    Authorization Type [employer]    Authorization Time Period 7/6-11/5    Authorization - Visit Number 1    Authorization - Number of Visits 12    PT Start Time 0930    PT Stop Time 1010    PT Time Calculation (min) 40 min    Activity Tolerance Patient tolerated treatment well    Behavior During Therapy [facility] for tasks assessed/performed             Past Medical History:  Diagnosis Date   Alopecia    Hyperlipidemia    Hypertension    Seborrhea capitis in adult 07/2019    Past Surgical History:  Procedure Laterality Date   CESAREAN SECTION      There were no vitals filed for this visit.   Subjective Assessment - 04/13/21 0935     Subjective She can control the urine a little bit better.    Patient is accompained by: Interpreter    Patient Stated Goals reduce leakage    Currently in Pain? No/denies    Multiple Pain Sites No                               [facility] Adult PT Treatment/Exercise - 04/13/21 0001       Self-Care   Self-Care Other Self-Care Comments    Other Self-Care Comments  education on bladder irritants and how they affect the bladder and increasing the water intake to hydrate the body; educated patient to lay on her back in 90/90 position to unweight her pelvis and reduce the prolapse      Therapeutic Activites    Therapeutic Activities ADL's    ADL's supine to sit with breathing out to reduce pressure on the pelvic floor and leakage; sit to stand and back with breath to reduce  pressure on the pelvic floor      Neuro Re-ed    Neuro Re-ed Details  legs at 90/90 with diaphgramatic breathing needing tactile cues for air to go into the abdomen and verbal cues to slowly breath;      Lumbar Exercises: Supine   Ab Set 10 reps;5 seconds    AB Set Limitations with pelvic floor contraction and squeezing a yoga block to engage the lower abdominals; hips at 90/90    Bridge with [employer] 15 reps    Bridge with [employer] Limitations with pelvic floor contraction    Other Supine Lumbar Exercises 5 quick contractions with verbal cues to not hold her breath                    PT Education - 04/13/21 0941     Education Details education on bladder irritants; increasing water intake to 8 glasses of water;Access Code: [facility]; education on transitional movement wihtout putting strain on the pelvic floor    Person(s) Educated Patient;Other (comment)   interpreter   Methods Explanation;Handout;Demonstration    Comprehension Verbalized understanding;Returned demonstration  PT Short Term Goals - 04/13/21 1019       PT SHORT TERM GOAL #2   Title understand what bladder irritants are and how they affect the bladder    Time 4    Period Weeks    Status Achieved               PT Long Term Goals - 04/06/21 1347       PT LONG TERM GOAL #1   Title Independent with advanced HEP for pelvic floor and core strength    Baseline not educated yet    Time 4    Period Months    Status New    Target Date 08/07/21      PT LONG TERM GOAL #2   Title able to walk to the bathroom without leaking urine due to improved strength of pelvic floor  >/= 3/5 strength holding for 10 seconds    Baseline strength is 3/5 holding 1 sec ; will  leak as she is walking to the commode    Time 4    Period Months    Status New    Target Date 08/07/21      PT LONG TERM GOAL #3   Title understand the urge to void to suppress the need to urinate quickly but to walk  slowly to the commode    Baseline not educated yet    Time 4    Period Weeks    Status New    Target Date 08/07/21      PT LONG TERM GOAL #4   Title minimal leakage with laughing and coughing due to improved pelvic floor contraction quickly and not hold her breath    Baseline holds her breath with a pelvic floor contraction    Time 4    Period Months    Status New    Target Date 08/07/21                   Plan - 04/13/21 1015     Clinical Impression Statement Patient reports she has less urinary leakage. She needs verbal cues to not hold her breath with her exercises. Patient is starting to learn transitional movements with correct breath to reduce strain on pelvic floor but difficult due ot weakness in core and arms. Patient was educated on bladder irritants with interpreter and understands how they affect the bladder. Patient will benefit from skilled therapy to improve pelvic floor coordination and strength to reduce her leakage.    Personal Factors and Comorbidities Time since onset of injury/illness/exacerbation;Age;Fitness;Comorbidity 1    Comorbidities c section x1 and  episiotomy x4    Examination-Activity Limitations Continence;Toileting;Locomotion Level    Examination-Participation Restrictions Community Activity    Stability/Clinical Decision Making Stable/Uncomplicated    Rehab Potential Excellent    PT Frequency 1x / week    PT Duration Other (comment)   4 onths   PT Treatment/Interventions ADLs/Self Care Home Management;Biofeedback;Therapeutic activities;Therapeutic exercise;Neuromuscular re-education;Patient/family education;Manual techniques;Dry needling    PT Next Visit Plan manual work o the perineal body and sides of the introitus for improved pelvic floor contraction; manual work to c-section scar; go over lifting without bearing down    PT Home Exercise Plan Access Code: [facility]    Recommended Other Services MD signed initial summary    Consulted and  Agree with Plan of Care Patient;Other (Comment)             Patient will benefit from skilled therapeutic intervention in  order to improve the following deficits and impairments:  Decreased endurance, Decreased scar mobility, Decreased activity tolerance, Decreased strength, Increased fascial restricitons, Increased muscle spasms, Decreased coordination  Visit Diagnosis: Muscle weakness (generalized)  Other lack of coordination  Urge incontinence     Problem List Patient Active Problem List   Diagnosis Date Noted   Vitamin D insufficiency 12/09/2019   Left sided abdominal pain 07/15/2019   Seborrhea capitis in adult 07/15/2019   Alopecia of scalp 07/15/2019   Neuropathy 07/15/2019   Essential hypertension 07/15/2019   Hyperlipidemia 07/15/2019   Language barrier to communication 09/21/2018   Pain in left foot 02/15/2017   Low back pain without sciatica 02/15/2017   Calculus of gallbladder and bile duct w/o cholecystitis or obstruction 04/14/2014   Flatulence 09/11/2013   GERD (gastroesophageal reflux disease) 09/11/2013   Pain due to dental caries 07/18/2013   Acute pharyngitis 04/02/2013   Sinusitis, acute 04/02/2013   Streptococcal sore throat 04/02/2013   Other malaise and fatigue 04/02/2013    Earlie Counts, PT 04/13/21 10:21 AM  [facility] [address] [place], [place], [address] Phone: [phone]   Fax:  [phone]  Name: Ebbie Sorenson MRN: [id number] Date of Birth: 1966-07-11

## 2021-04-20 ENCOUNTER — Encounter: Payer: Self-pay | Admitting: Physical Therapy

## 2021-04-20 ENCOUNTER — Encounter: Payer: Medicaid Other | Admitting: Physical Therapy

## 2021-04-20 ENCOUNTER — Other Ambulatory Visit: Payer: Self-pay

## 2021-04-20 DIAGNOSIS — R278 Other lack of coordination: Secondary | ICD-10-CM

## 2021-04-20 DIAGNOSIS — M6281 Muscle weakness (generalized): Secondary | ICD-10-CM

## 2021-04-20 DIAGNOSIS — N3941 Urge incontinence: Secondary | ICD-10-CM | POA: Diagnosis not present

## 2021-04-20 NOTE — Patient Instructions (Signed)
Access Code: [facility] URL: [email or web address] Date: 04/20/2021 Prepared by: Earlie Counts  Exercises Diaphragmatic Breathing at 90/90 Supported - 1 x daily - 7 x weekly - 1 sets - 10 reps Supine Hip Adduction Isometric with Ball - 1 x daily - 7 x weekly - 1 sets - 10 reps - 5 sec hold Supine Bridge with Mini Swiss Ball Between Knees - 1 x daily - 7 x weekly - 1 sets - 15 reps Functional Pelvic Floor Contractions with Cough Simulation - 1 x daily - 7 x weekly - 1 sets - 10 reps Quadruped Pelvic Floor Contraction with Hip Adduction and Towel Roll - 1 x daily - 7 x weekly - 1 sets - 10 reps - 5 sec hold Mini Squat - 1 x daily - 7 x weekly - 1 sets - 10 reps - 5 sec hold Earlie Counts, PT [facility] [address] [place], [place] [address] W: [phone] [email or web address]

## 2021-04-20 NOTE — Therapy (Signed)
[place] at [facility] [address], [place], [place], [address] Phone: [phone]   Fax:  [phone]  Physical Therapy Treatment  Patient Details  Name: Lauren Petersen MRN: [id number] Date of Birth: December 11, 1965 Referring Provider (PT): Dr. Caren Macadam   Encounter Date: 04/20/2021   PT End of Session - 04/20/21 1014     Visit Number 3    Date for PT Re-Evaluation 08/07/21    Authorization Time Period 7/6-11/5    Authorization - Visit Number 2    Authorization - Number of Visits 12    PT Start Time 0930    PT Stop Time 1010    PT Time Calculation (min) 40 min    Activity Tolerance Patient tolerated treatment well    Behavior During Therapy [facility] for tasks assessed/performed             Past Medical History:  Diagnosis Date   Alopecia    Hyperlipidemia    Hypertension    Seborrhea capitis in adult 07/2019    Past Surgical History:  Procedure Laterality Date   CESAREAN SECTION      There were no vitals filed for this visit.   Subjective Assessment - 04/20/21 0933     Subjective A little bit better.    Patient Stated Goals reduce leakage    Currently in Pain? No/denies    Multiple Pain Sites No                               [facility] Adult PT Treatment/Exercise - 04/20/21 0001       Therapeutic Activites    Therapeutic Activities Other Therapeutic Activities    Other Therapeutic Activities standing with flexing her hips and knees with keeping the distance between the pubic bone and rib cage to take pressure off of the bladder, she needed tactile cues to keep proper posture      Lumbar Exercises: Seated   Other Seated Lumbar Exercises sitting 5 quick pelvic floor contraction with full relaxation; squeeze the pelvic floor lean forward and cough 10x with no urinary leakage      Lumbar Exercises: Supine   Ab Set 10 reps;5 seconds    AB Set Limitations with pelvic floor  contraction and squeezing a yoga block to engage the lower abdominals; tactile cues to low back to flatten back    Bridge with [employer] 15 reps    Bridge with [employer] Limitations with pelvic floor contraction and each vertebrae coming down one by one using tactile and verbal cues      Lumbar Exercises: Quadruped   Other Quadruped Lumbar Exercises squeeze towel rool with pelvic floor and lower abdominal contraction holding for 5 sec.                    PT Education - 04/20/21 1012     Education Details Access Code: [facility]    Person(s) Educated Patient    Methods Explanation;Demonstration;Verbal cues;Handout    Comprehension Returned demonstration;Verbalized understanding              PT Short Term Goals - 04/20/21 0933       PT SHORT TERM GOAL #1   Title Patient will be independent with HEP     Time 4    Period Weeks    Status Achieved      PT SHORT TERM GOAL #2  Title understand what bladder irritants are and how they affect the bladder    Time 4    Period Weeks    Status Achieved               PT Long Term Goals - 04/20/21 0934       PT LONG TERM GOAL #2   Title able to walk to the bathroom without leaking urine due to improved strength of pelvic floor  >/= 3/5 strength holding for 10 seconds    Baseline Patient can reach the bathroom without leaking    Time 4    Period Months    Status On-going      PT LONG TERM GOAL #4   Title minimal leakage with laughing and coughing due to improved pelvic floor contraction quickly and not hold her breath    Baseline holds her breath with a pelvic floor contraction    Time 4    Period Months    Status On-going                   Plan - 04/20/21 1014     Clinical Impression Statement Patient is able to walk to the commode without leakage. She does not know about coughing and laughing yet. She has difficulty with lower abdominal engagement is supine and needs tactile cues to flatten her  back and a towel roll under her pelvis to influence posterior tillt. Patient will round her back when squatting and needs tactile cues to flex her hips and keep the distance between the pubic bone and rib cage. Patient understand what bladder irritants are. Patient will benefit from skilled therapy to improve pelvic floor coordination and strength to reduce her leakage.    Personal Factors and Comorbidities Time since onset of injury/illness/exacerbation;Age;Fitness;Comorbidity 1    Comorbidities c section x1 and  episiotomy x4    Examination-Activity Limitations Continence;Toileting;Locomotion Level    Examination-Participation Restrictions Community Activity    Stability/Clinical Decision Making Stable/Uncomplicated    Rehab Potential Excellent    PT Frequency 1x / week    PT Duration Other (comment)   4 months   PT Treatment/Interventions ADLs/Self Care Home Management;Biofeedback;Therapeutic activities;Therapeutic exercise;Neuromuscular re-education;Patient/family education;Manual techniques;Dry needling    PT Next Visit Plan manual work o the perineal body and sides of the introitus for improved pelvic floor contraction; manual work to c-section scar; review  lifting without bearing down, review HEP    PT Home Exercise Plan Access Code: [facility]    Consulted and Agree with Plan of Care Patient;Other (Comment)             Patient will benefit from skilled therapeutic intervention in order to improve the following deficits and impairments:  Decreased endurance, Decreased scar mobility, Decreased activity tolerance, Decreased strength, Increased fascial restricitons, Increased muscle spasms, Decreased coordination  Visit Diagnosis: Muscle weakness (generalized)  Other lack of coordination  Urge incontinence     Problem List Patient Active Problem List   Diagnosis Date Noted   Vitamin D insufficiency 12/09/2019   Left sided abdominal pain 07/15/2019   Seborrhea capitis in adult  07/15/2019   Alopecia of scalp 07/15/2019   Neuropathy 07/15/2019   Essential hypertension 07/15/2019   Hyperlipidemia 07/15/2019   Language barrier to communication 09/21/2018   Pain in left foot 02/15/2017   Low back pain without sciatica 02/15/2017   Calculus of gallbladder and bile duct w/o cholecystitis or obstruction 04/14/2014   Flatulence 09/11/2013   GERD (gastroesophageal reflux disease) 09/11/2013  Pain due to dental caries 07/18/2013   Acute pharyngitis 04/02/2013   Sinusitis, acute 04/02/2013   Streptococcal sore throat 04/02/2013   Other malaise and fatigue 04/02/2013    Earlie Counts, PT 04/20/21 10:19 AM 04/20/2021, 10:19 AM  [facility] at [facility] [address], [place], [place], [address] Phone: [phone]   Fax:  [phone]  Name: Lauren Petersen MRN: [id number] Date of Birth: 01/02/1966

## 2021-04-28 ENCOUNTER — Telehealth: Payer: Self-pay

## 2021-04-28 NOTE — Telephone Encounter (Signed)
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

## 2021-04-28 NOTE — Telephone Encounter (Signed)
Patient notified

## 2021-04-28 NOTE — Telephone Encounter (Signed)
Please let them know that it is okay to use this along with her current medications.

## 2021-05-04 ENCOUNTER — Other Ambulatory Visit: Payer: Self-pay

## 2021-05-04 ENCOUNTER — Encounter: Payer: Medicaid Other | Attending: Family Medicine | Admitting: Physical Therapy

## 2021-05-04 ENCOUNTER — Encounter: Payer: Self-pay | Admitting: Physical Therapy

## 2021-05-04 DIAGNOSIS — R278 Other lack of coordination: Secondary | ICD-10-CM | POA: Insufficient documentation

## 2021-05-04 DIAGNOSIS — M6281 Muscle weakness (generalized): Secondary | ICD-10-CM | POA: Diagnosis not present

## 2021-05-04 DIAGNOSIS — N393 Stress incontinence (female) (male): Secondary | ICD-10-CM | POA: Diagnosis not present

## 2021-05-04 DIAGNOSIS — N3941 Urge incontinence: Secondary | ICD-10-CM | POA: Diagnosis not present

## 2021-05-04 NOTE — Patient Instructions (Addendum)
Urge Incontinence  Ideal urination frequency is every 2-4 wakeful hours, which equates to 5-8 times within a 24-hour period.   Urge incontinence is leakage that occurs when the bladder muscle contracts, creating a sudden need to go before getting to the bathroom.   Going too often when your bladder isn't actually full can disrupt the body's automatic signals to store and hold urine longer, which will increase urgency/frequency.  In this case, the bladder "is running the show" and strategies can be learned to retrain this pattern.   One should be able to control the first urge to urinate, at around 161m.  The bladder can hold up to a "grande latte," or 4062m To help you gain control, practice the Urge Drill below when urgency strikes.  This drill will help retrain your bladder signals and allow you to store and hold urine longer.  The overall goal is to stretch out your time between voids to reach a more manageable voiding schedule.    Practice your "quick flicks" often throughout the day (each waking hour) even when you don't need feel the urge to go.  This will help strengthen your pelvic floor muscles, making them more effective in controlling leakage.  Urge Drill  When you feel an urge to go, follow these steps to regain control: Stop what you are doing and be still Take one deep breath, directing your air into your abdomen Think an affirming thought, such as "I've got this." Do 5 quick flicks of your pelvic floor Walk with control to the bathroom to void, or delay voiding   Access Code: [email or web address] CaliforniaRL: [email or web address] Date: 05/04/2021 Prepared by: ChEarlie CountsExercises Diaphragmatic Breathing at 90/90 Supported - 1 x daily - 7 x weekly - 1 sets - 10 reps Functional Pelvic Floor Contractions with Cough Simulation - 1 x daily - 7 x weekly - 1 sets - 10 reps Quadruped Pelvic Floor Contraction with Hip Adduction and Towel Roll - 1 x daily - 7 x weekly - 1 sets - 10  reps - 5 sec hold Mini Squat - 1 x daily - 7 x weekly - 1 sets - 10 reps - 5 sec hold Seated Pelvic Floor Contraction - 4 x daily - 7 x weekly - 1 sets - 5 reps - 1 sec hold Hooklying Isometric Hip Flexion - 1 x daily - 4 x weekly - 1 sets - 10 reps - 5 sec hold Hooklying Isometric Hip Flexion with Opposite Arm - 1 x daily - 4 x weekly - 1 sets - 10 reps - 5 sec hold Bridge - 1 x daily - 4 x weekly - 1 sets - 10 reps  ChEarlie CountsPT WoCentral [facility] [address]SuFranklinrYork[place] [address]: [email or web address]

## 2021-05-04 NOTE — Therapy (Signed)
[place] at [facility] [address] [place], [place], [address] Phone: [phone]   Fax:  [phone]  Physical Therapy Treatment  Patient Details  Name: Lauren Petersen MRN: [id number] Date of Birth: [birth year] Referring Provider (PT): Dr. Caren Macadam   Encounter Date: 05/04/2021   PT End of Session - 05/04/21 0942     Visit Number 4    Date for PT Re-Evaluation 08/07/21    Authorization Type [employer]    Authorization Time Period 7/6-11/5    Authorization - Visit Number 3    Authorization - Number of Visits 12    PT Start Time 0930    PT Stop Time 1015    PT Time Calculation (min) 45 min    Activity Tolerance Patient tolerated treatment well    Behavior During Therapy [facility] for tasks assessed/performed             Past Medical History:  Diagnosis Date   Alopecia    Hyperlipidemia    Hypertension    Seborrhea capitis in adult 07/2019    Past Surgical History:  Procedure Laterality Date   CESAREAN SECTION      There were no vitals filed for this visit.   Subjective Assessment - 05/04/21 0937     Subjective She is doing better. When she misses her medication she will leak urine. When on the medication she is able to reach the commode with greater ease.    Patient is accompained by: Interpreter    Patient Stated Goals reduce leakage    Currently in Pain? No/denies    Multiple Pain Sites No                               [facility] Adult PT Treatment/Exercise - 05/04/21 0001       Self-Care   Self-Care Other Self-Care Comments    Other Self-Care Comments  educated patient on urge to void technique to reduce urinary leakage.and waiting for 2 hours to urinate      Neuro Re-ed    Neuro Re-ed Details  diaphgramtic breathing in sitting with verbal cues to not hold her breath and relax her shoulders; pelvic floor contraction with quick flicks      Lumbar Exercises:  Supine   Bridge 10 reps    Bridge Limitations with verbal cues to fully extend her hips    Isometric Hip Flexion 10 reps;5 seconds   with pelvic floor contraction   Isometric Hip Flexion Limitations 10x diagonals with pelvic floor contraction                    PT Education - 05/04/21 1012     Education Details Access Code: [facility]; urge drill    Person(s) Educated Patient;Other (comment)   interpreter   Methods Explanation;Handout    Comprehension Verbalized understanding;Returned demonstration              PT Short Term Goals - 04/20/21 0933       PT SHORT TERM GOAL #1   Title Patient will be independent with HEP     Time 4    Period Weeks    Status Achieved      PT SHORT TERM GOAL #2   Title understand what bladder irritants are and how they affect the bladder    Time 4    Period Weeks    Status Achieved  PT Long Term Goals - 05/04/21 0939       PT LONG TERM GOAL #1   Title Independent with advanced HEP for pelvic floor and core strength    Time 4    Period Months    Status On-going      PT LONG TERM GOAL #2   Title able to walk to the bathroom without leaking urine due to improved strength of pelvic floor  >/= 3/5 strength holding for 10 seconds    Baseline Patient can reach the bathroom without leaking with medication; when not on the medication she will leak    Time 4    Period Months    Status On-going      PT LONG TERM GOAL #3   Title understand the urge to void to suppress the need to urinate quickly but to walk slowly to the commode    Baseline not educated yet    Time 4    Period Weeks    Status On-going      PT LONG TERM GOAL #4   Title minimal leakage with laughing and coughing due to improved pelvic floor contraction quickly and not hold her breath    Baseline does not leak when laugh or cough with and without medication    Time 4    Period Months    Status Achieved                   Plan -  05/04/21 1020     Clinical Impression Statement Patient has learned the urge to void drill and is going to try to hold her urine for 2 hours instead of 1 hour. Patient is not leaking with coughing and laughing when she is not taking her medication. Patient will leak on the way to the commode when she is not taking her  medication. She is not able to feel the pelvic floor contraction. Patient is not working on her  core strength with pelvic floor contraction. Patient will benefit from skilled therapy to improve pelvic floor coordination and strength to reduce her leakage.    Personal Factors and Comorbidities Time since onset of injury/illness/exacerbation;Age;Fitness;Comorbidity 1    Comorbidities c section x1 and  episiotomy x4    Examination-Activity Limitations Continence;Toileting;Locomotion Level    Examination-Participation Restrictions Community Activity    Stability/Clinical Decision Making Stable/Uncomplicated    Rehab Potential Excellent    PT Frequency 1x / week    PT Duration Other (comment)   4 months   PT Treatment/Interventions ADLs/Self Care Home Management;Biofeedback;Therapeutic activities;Therapeutic exercise;Neuromuscular re-education;Patient/family education;Manual techniques;Dry needling    PT Next Visit Plan holding pelvic floor ocntraction for 5 seconds; quadruped lift extremity, supine dead bug    PT Home Exercise Plan Access Code: [facility]    Consulted and Agree with Plan of Care Patient;Other (Comment)   interpreter            Patient will benefit from skilled therapeutic intervention in order to improve the following deficits and impairments:  Decreased endurance, Decreased scar mobility, Decreased activity tolerance, Decreased strength, Increased fascial restricitons, Increased muscle spasms, Decreased coordination  Visit Diagnosis: Muscle weakness (generalized)  Other lack of coordination  Urge incontinence     Problem List Patient Active Problem List    Diagnosis Date Noted   Vitamin D insufficiency 12/09/2019   Left sided abdominal pain 07/15/2019   Seborrhea capitis in adult 07/15/2019   Alopecia of scalp 07/15/2019   Neuropathy 07/15/2019   Essential hypertension 07/15/2019  Hyperlipidemia 07/15/2019   Language barrier to communication 09/21/2018   Pain in left foot 02/15/2017   Low back pain without sciatica 02/15/2017   Calculus of gallbladder and bile duct w/o cholecystitis or obstruction 04/14/2014   Flatulence 09/11/2013   GERD (gastroesophageal reflux disease) 09/11/2013   Pain due to dental caries 07/18/2013   Acute pharyngitis 04/02/2013   Sinusitis, acute 04/02/2013   Streptococcal sore throat 04/02/2013   Other malaise and fatigue 04/02/2013    Earlie Counts, PT 05/04/21 10:25 AM  [place] [facility] at [facility] [address] [place], [place], [address] Phone: [phone]   Fax:  [phone]  Name: Lauren Petersen MRN: [id number] Date of Birth: 23-Nov-1965

## 2021-05-17 ENCOUNTER — Encounter: Payer: Self-pay | Admitting: Nurse Practitioner

## 2021-05-17 ENCOUNTER — Ambulatory Visit: Payer: Medicaid Other | Admitting: Nurse Practitioner

## 2021-05-17 ENCOUNTER — Other Ambulatory Visit: Payer: Self-pay

## 2021-05-17 VITALS — BP 126/45 | HR 72 | Temp 97.7°F | Ht 59.0 in | Wt 137.0 lb

## 2021-05-17 DIAGNOSIS — R6882 Decreased libido: Secondary | ICD-10-CM | POA: Diagnosis not present

## 2021-05-17 DIAGNOSIS — I8393 Asymptomatic varicose veins of bilateral lower extremities: Secondary | ICD-10-CM | POA: Diagnosis not present

## 2021-05-17 DIAGNOSIS — I10 Essential (primary) hypertension: Secondary | ICD-10-CM

## 2021-05-17 DIAGNOSIS — E876 Hypokalemia: Secondary | ICD-10-CM | POA: Diagnosis not present

## 2021-05-17 NOTE — Patient Instructions (Addendum)
Hypokalemia Hypokalemia means that the amount of potassium in the blood is lower than normal. Potassium is a chemical (electrolyte) that helps regulate the amount of fluid in the body. It also stimulates muscle tightening (contraction) and helps nerves work properly. Normally, most of the body's potassium is inside cells, and only a very small amount is in the blood. Because the amount in the blood is so small, minorchanges to potassium levels in the blood can be life-threatening. What are the causes? This condition may be caused by: Antibiotic medicine. Diarrhea or vomiting. Taking too much of a medicine that helps you have a bowel movement (laxative) can cause diarrhea and lead to hypokalemia. Chronic kidney disease (CKD). Medicines that help the body get rid of excess fluid (diuretics). Eating disorders, such as bulimia. Low magnesium levels in the body. Sweating a lot. What are the signs or symptoms? Symptoms of this condition include: Weakness. Constipation. Fatigue. Muscle cramps. Mental confusion. Skipped heartbeats or irregular heartbeat (palpitations). Tingling or numbness. How is this diagnosed? This condition is diagnosed with a blood test. How is this treated? This condition may be treated by: Taking potassium supplements by mouth. Adjusting the medicines that you take. Eating more foods that contain a lot of potassium. If your potassium level is very low, you may need to get potassium through anIV and be monitored in the hospital. Follow these instructions at home:  Take over-the-counter and prescription medicines only as told by your health care provider. This includes vitamins and supplements. Eat a healthy diet. A healthy diet includes fresh fruits and vegetables, whole grains, healthy fats, and lean proteins. If instructed, eat more foods that contain a lot of potassium. This includes: Nuts, such as peanuts and pistachios. Seeds, such as sunflower seeds and pumpkin  seeds. Peas, lentils, and lima beans. Whole grain and bran cereals and breads. Fresh fruits and vegetables, such as apricots, avocado, bananas, cantaloupe, kiwi, oranges, tomatoes, asparagus, and potatoes. Orange juice. Tomato juice. Red meats. Yogurt. Keep all follow-up visits as told by your health care provider. This is important. Contact a health care provider if you: Have weakness that gets worse. Feel your heart pounding or racing. Vomit. Have diarrhea. Have diabetes (diabetes mellitus) and you have trouble keeping your blood sugar (glucose) in your target range. Get help right away if you: Have chest pain. Have shortness of breath. Have vomiting or diarrhea that lasts for more than 2 days. Faint. Summary Hypokalemia means that the amount of potassium in the blood is lower than normal. This condition is diagnosed with a blood test. Hypokalemia may be treated by taking potassium supplements, adjusting the medicines that you take, or eating more foods that are high in potassium. If your potassium level is very low, you may need to get potassium through an IV and be monitored in the hospital. This information is not intended to replace advice given to you by your health care provider. Make sure you discuss any questions you have with your healthcare provider. Document Revised: 05/02/2018 Document Reviewed: 05/02/2018 Elsevier Patient Education  2022 [place].  Varicose Veins Varicose veins are veins that have become enlarged, bulged, and twisted. Theymost often appear in the legs. What are the causes? This condition is caused by damage to the valves in the vein. These valves help blood return to your heart. When they are damaged and they stop working properly, blood may flow backward and back up in the veins near the skin,causing the veins to get larger and appear twisted.  The condition can result from any issue that causes blood to back up, likepregnancy, prolonged standing,  or obesity. What increases the risk? This condition is more likely to develop in people who are: On their feet a lot. Pregnant. Overweight. What are the signs or symptoms? Symptoms of this condition include: Bulging, twisted, and bluish veins. A feeling of heaviness. This may be worse at the end of the day. Leg pain. This may be worse at the end of the day. Swelling in the leg. Changes in skin color over the veins. How is this diagnosed? This condition may be diagnosed based on your symptoms, a physical exam, and anultrasound test. How is this treated? Treatment for this condition may involve: Avoiding sitting or standing in one position for long periods of time. Wearing compression stockings. These stockings help to prevent blood clots and reduce swelling in the legs. Raising (elevating) the legs when resting. Losing weight. Exercising regularly. If you have persistent symptoms or want to improve the way your varicose veins look, you may choose to have a procedure to close the varicose veins off or toremove them. Treatments to close off the veins include: Sclerotherapy. In this treatment, a solution is injected into a vein to close it off. Laser treatment. In this treatment, the vein is heated with a laser to close it off. Radiofrequency vein ablation. In this treatment, an electrical current produced by radio waves is used to close off the vein. Treatments to remove the veins include: Phlebectomy. In this treatment, the veins are removed through small incisions made over the veins. Vein ligation and stripping. In this treatment, incisions are made over the veins. The veins are then removed after being tied (ligated) with stitches (sutures). Follow these instructions at home: Activity Walk as much as possible. Walking increases blood flow. This helps blood return to the heart and takes pressure off your veins. It also increases your cardiovascular strength. Follow your health care  provider's instructions about exercising. Do not stand or sit in one position for a long period of time. Do not sit with your legs crossed. Rest with your legs raised during the day. General instructions  Follow any diet instructions given to you by your health care provider. Wear compression stockings as directed by your health care provider. Do not wear other kinds of tight clothing around your legs, pelvis, or waist. Elevate your legs at night to above the level of your heart. If you get a cut in the skin over the varicose vein and the vein bleeds: Lie down with your leg raised. Apply firm pressure to the cut with a clean cloth until the bleeding stops. Place a bandage (dressing) on the cut.  Contact a health care provider if: The skin around your varicose veins starts to break down. You have pain, redness, tenderness, or hard swelling over a vein. You are uncomfortable because of pain. You get a cut in the skin over a varicose vein and it will not stop bleeding. Summary Varicose veins are veins that have become enlarged, bulged, and twisted. They most often appear in the legs. This condition is caused by damage to the valves in the vein. These valves help blood return to your heart. Treatment for this condition includes frequent movements, wearing compression stockings, losing weight, and exercising regularly. In some cases, procedures are done to close off or remove the veins. Treatment for this condition may include wearing compression stockings, elevating the legs, losing weight, and engaging in regular activity. In  some cases, procedures are done to close off or remove the veins. This information is not intended to replace advice given to you by your health care provider. Make sure you discuss any questions you have with your healthcare provider. Document Revised: 01/30/2020 Document Reviewed: 01/30/2020 Elsevier Patient Education  2022 [place].   Varicose veins and spider  veins Varicose veins and spider veins are swollen, twisted veins that usually appear on the legs. Women are more likely to have varicose veins and spider veins. Pregnancy, older age, and obesity can increase your risk of varicose veins and spider veins. Varicose veins and spider veins are often painless and do not usually cause health problems. If they do cause symptoms, or if you want to have them removed, talk to your doctor or nurse about treatment options.

## 2021-05-17 NOTE — Progress Notes (Signed)
[place], [place]  [address] Phone:  [phone]   Fax:  [phone]   Established Patient Office Visit  Subjective:  Patient ID: Lauren Petersen, female    DOB: 1966-01-16  Age: 55 y.o. MRN: [id number]  CC:  Chief Complaint  Patient presents with   Follow-up    3 month follow up; varicose vein in left leg.  Hearing loss in left ear, started after airplane take out.    HPI Lauren Petersen Ceasar Lund presents for follow up. She  has a past medical history of Alopecia, Hyperlipidemia, Hypertension, and Seborrhea capitis in adult (07/2019).   She is having some hearing loss in her left The pain is mild and on and off. She denies any dizziness, headache or congestion. She has a mild hearing change on her in office exam.   Varicose Veins Patient complains of varicose veins. Symptoms include spider veins on the left greater than right. Symptoms have been ongoing for about several years. Symptoms have stabilized. Patient has not been evaluated for this previously.  Evaluation to date has included: none. Treatment to date has included: none.  Hypertension Patient is here for follow-up of elevated blood pressure. She is exercising and is adherent to a low-salt diet. Blood pressure is not monitored at home. Cardiac symptoms: none. Patient denies chest pain, chest pressure/discomfort, claudication, dyspnea, exertional chest pressure/discomfort, fatigue, irregular heart beat, near-syncope, orthopnea, palpitations, paroxysmal nocturnal dyspnea, syncope, and tachypnea. Cardiovascular risk factors: hypertension. Use of agents associated with hypertension: none. History of target organ damage: none.   Past Medical History:  Diagnosis Date   Alopecia    Hyperlipidemia    Hypertension    Seborrhea capitis in adult 07/2019    Past Surgical History:  Procedure Laterality Date   CESAREAN SECTION      History reviewed. No pertinent family  history.  Social History   Socioeconomic History   Marital status: Married    Spouse name: Not on file   Number of children: Not on file   Years of education: Not on file   Highest education level: Not on file  Occupational History   Not on file  Tobacco Use   Smoking status: Never   Smokeless tobacco: Never  Vaping Use   Vaping Use: Never used  Substance and Sexual Activity   Alcohol use: No   Drug use: No   Sexual activity: Yes    Birth control/protection: I.U.D.  Other Topics Concern   Not on file  Social History Narrative   Not on file   Social Determinants of Health   Financial Resource Strain: Not on file  Food Insecurity: No Food Insecurity   Worried About Running Out of Food in the Last Year: Never true   Ran Out of Food in the Last Year: Never true  Transportation Needs: No Transportation Needs   Lack of Transportation (Medical): No   Lack of Transportation (Non-Medical): No  Physical Activity: Not on file  Stress: Not on file  Social Connections: Not on file  Intimate Partner Violence: Not on file    Outpatient Medications Prior to Visit  Medication Sig Dispense Refill   cholecalciferol (VITAMIN D3) 25 MCG (1000 UNIT) tablet Take 1,000 Units by mouth daily.     DERMA-SMOOTHE/FS SCALP 0.01 % OIL APPLY A SMALL AMOUNT TO AFFECTED AREA ON SCALP EVERY NIGHT     hydrochlorothiazide (HYDRODIURIL) 25 MG tablet Take 1 tablet (25 mg total) by mouth daily.  90 tablet 3   Multiple Vitamin (MULTIVITAMIN PO) Take by mouth.     norethindrone (MICRONOR,CAMILA,ERRIN) 0.35 MG tablet Take 1 tablet by mouth daily.     oxybutynin (DITROPAN XL) 10 MG 24 hr tablet Take 1 tablet (10 mg total) by mouth at bedtime. 30 tablet 6   selenium sulfide (SELSUN) 2.5 % shampoo Apply topically daily as needed. 118 mL 11   sildenafil (REVATIO) 20 MG tablet Take 1 tablet (20 mg total) by mouth 3 (three) times daily. 10 tablet 5   potassium chloride (KLOR-CON) 10 MEQ tablet Take 1 tablet (10  mEq total) by mouth daily. 30 tablet 0   Biotin 1000 MCG tablet Take 1,000 mcg by mouth 3 (three) times daily.     No facility-administered medications prior to visit.    No Known Allergies  ROS Review of Systems    Objective:    Physical Exam Constitutional:      General: She is not in acute distress.    Appearance: She is not ill-appearing or toxic-appearing.  HENT:     Head: Normocephalic and atraumatic.     Nose: Nose normal.  Cardiovascular:     Rate and Rhythm: Normal rate and regular rhythm.     Pulses: Normal pulses.     Heart sounds: Normal heart sounds.  Pulmonary:     Effort: Pulmonary effort is normal.     Breath sounds: Normal breath sounds.  Abdominal:     Palpations: Abdomen is soft.  Genitourinary:    Rectum: Guaiac result positive.  Musculoskeletal:        General: Normal range of motion.     Cervical back: Normal range of motion.  Skin:    General: Skin is warm and dry.     Capillary Refill: Capillary refill takes less than 2 seconds.     Comments: Spider veins bilateral lower thigh lateral aspect  Neurological:     General: No focal deficit present.     Mental Status: She is alert and oriented to person, place, and time.  Psychiatric:        Mood and Affect: Mood normal.        Behavior: Behavior normal.        Thought Content: Thought content normal.        Judgment: Judgment normal.    BP (!) 126/45 (BP Location: Left Arm, Patient Position: Sitting)   Pulse 72   Temp 97.7 F (36.5 C)   Ht 4' 11"  (1.499 m)   Wt 137 lb 0.6 oz (62.2 kg)   LMP 07/05/2020 (Exact Date)   SpO2 100%   BMI 27.68 kg/m  Wt Readings from Last 3 Encounters:  05/17/21 137 lb 0.6 oz (62.2 kg)  02/08/21 135 lb 0.4 oz (61.2 kg)  01/18/21 137 lb 9.6 oz (62.4 kg)     Health Maintenance Due  Topic Date Due   PAP SMEAR-Modifier  10/17/2016   INFLUENZA VACCINE  05/03/2021    There are no preventive care reminders to display for this patient.  Lab Results   Component Value Date   TSH 1.170 12/06/2019   Lab Results  Component Value Date   WBC 5.1 12/06/2019   HGB 14.8 12/06/2019   HCT 43.9 12/06/2019   MCV 90 12/06/2019   PLT 259 12/06/2019   Lab Results  Component Value Date   NA 138 02/08/2021   K 2.9 (L) 02/08/2021   CO2 24 09/21/2018   GLUCOSE 149 (H) 02/08/2021   BUN  10 02/08/2021   CREATININE 0.89 02/08/2021   BILITOT 0.3 02/08/2021   ALKPHOS 66 02/08/2021   AST 31 02/08/2021   ALT 9 12/26/2014   PROT 7.4 02/08/2021   ALBUMIN 4.5 02/08/2021   CALCIUM 9.7 02/08/2021   EGFR 77 02/08/2021   Lab Results  Component Value Date   CHOL 146 12/06/2019   Lab Results  Component Value Date   HDL 45 12/06/2019   Lab Results  Component Value Date   LDLCALC 79 12/06/2019   Lab Results  Component Value Date   TRIG 126 12/06/2019   Lab Results  Component Value Date   CHOLHDL 3.2 12/06/2019   Lab Results  Component Value Date   HGBA1C 5.7 (H) 12/26/2014      Assessment & Plan:   Problem List Items Addressed This Visit       Cardiovascular and Mediastinum   Essential hypertension - Primary Stable Encouraged on going compliance with current medication regimen Encouraged home monitoring and recording BP <130/80 Eating a heart-healthy diet with less salt Encouraged regular physical activity  Recommend Weight loss    Other Visit Diagnoses     Hypokalemia     Evaluation pending   Relevant Orders   Comp. Metabolic Panel (12) (Completed)   Low libido  Improved with current treatment      Spider veins of both lower extremities     Persistent however not painful we will continue to evaluate before any additional treatment       No orders of the defined types were placed in this encounter.   Follow-up: Return in about 3 months (around 08/17/2021) for [place] [[id number]].    Vevelyn Francois, NP

## 2021-05-18 ENCOUNTER — Encounter: Payer: Self-pay | Admitting: Physical Therapy

## 2021-05-18 ENCOUNTER — Encounter: Payer: Medicaid Other | Admitting: Physical Therapy

## 2021-05-18 DIAGNOSIS — N3941 Urge incontinence: Secondary | ICD-10-CM | POA: Diagnosis not present

## 2021-05-18 DIAGNOSIS — M6281 Muscle weakness (generalized): Secondary | ICD-10-CM | POA: Diagnosis not present

## 2021-05-18 DIAGNOSIS — R278 Other lack of coordination: Secondary | ICD-10-CM | POA: Diagnosis not present

## 2021-05-18 DIAGNOSIS — N393 Stress incontinence (female) (male): Secondary | ICD-10-CM | POA: Diagnosis not present

## 2021-05-18 LAB — COMP. METABOLIC PANEL (12)
AST: 14 IU/L (ref 0–40)
Albumin/Globulin Ratio: 1.6 (ref 1.2–2.2)
Albumin: 4.5 g/dL (ref 3.8–4.9)
Alkaline Phosphatase: 69 IU/L (ref 44–121)
BUN/Creatinine Ratio: 17 (ref 9–23)
BUN: 13 mg/dL (ref 6–24)
Bilirubin Total: 0.3 mg/dL (ref 0.0–1.2)
Calcium: 10 mg/dL (ref 8.7–10.2)
Chloride: 104 mmol/L (ref 96–106)
Creatinine, Ser: 0.78 mg/dL (ref 0.57–1.00)
Globulin, Total: 2.9 g/dL (ref 1.5–4.5)
Glucose: 89 mg/dL (ref 65–99)
Potassium: 3.8 mmol/L (ref 3.5–5.2)
Sodium: 141 mmol/L (ref 134–144)
Total Protein: 7.4 g/dL (ref 6.0–8.5)
eGFR: 91 mL/min/{1.73_m2} (ref >59)

## 2021-05-18 NOTE — Patient Instructions (Signed)
Access Code: [facility] URL: [email or web address] Date: 05/18/2021 Prepared by: Earlie Counts  Exercises Diaphragmatic Breathing at 90/90 Supported - 1 x daily - 7 x weekly - 1 sets - 10 reps Child's Pose Stretch - 1 x daily - 7 x weekly - 1 sets - 2 reps - 30 sec hold Mini Squat - 1 x daily - 7 x weekly - 1 sets - 10 reps - 5 sec hold Seated Pelvic Floor Contraction - 3 x daily - 7 x weekly - 1 sets - 8 reps - 8 sec hold Functional Pelvic Floor Contractions with Cough Simulation - 1 x daily - 7 x weekly - 1 sets - 10 reps Seated Pelvic Floor Contraction - 4 x daily - 7 x weekly - 1 sets - 5 reps - 1 sec hold Bridge - 1 x daily - 4 x weekly - 1 sets - 10 reps Dead Bug - 1 x daily - 4 x weekly - 1 sets - 10 reps Quadruped Exhale with Pelvic Floor Contraction and Arm Raise - 1 x daily - 4 x weekly - 1 sets - 10 reps Quadruped Leg Lifts - 1 x daily - 4 x weekly - 1 sets - 10 reps Earlie Counts, PT [facility] [address] [place], [place] [address] W: [phone] [email or web address]

## 2021-05-18 NOTE — Therapy (Signed)
[place] at [facility] [address], [place], [place], [address] Phone: [phone]   Fax:  [phone]  Physical Therapy Treatment  Patient Details  Name: Markiesha Smirl MRN: [id number] Date of Birth: [birth year] Referring Provider (PT): Dr. Caren Macadam   Encounter Date: 05/18/2021   PT End of Session - 05/18/21 1038     Visit Number 5    Date for PT Re-Evaluation 08/07/21    Authorization Type [employer]    Authorization Time Period 7/6-11/5    Authorization - Visit Number 4    Authorization - Number of Visits 12    PT Start Time [id number]    PT Stop Time 1115    PT Time Calculation (min) 45 min    Activity Tolerance Patient tolerated treatment well    Behavior During Therapy [facility] for tasks assessed/performed             Past Medical History:  Diagnosis Date   Alopecia    Hyperlipidemia    Hypertension    Seborrhea capitis in adult 07/2019    Past Surgical History:  Procedure Laterality Date   CESAREAN SECTION      There were no vitals filed for this visit.   Subjective Assessment - 05/18/21 1033     Subjective She is doing better with controlling her urine. No urine leakage.    Patient is accompained by: Interpreter    Patient Stated Goals reduce leakage    Currently in Pain? No/denies                               [facility] Adult PT Treatment/Exercise - 05/18/21 0001       Lumbar Exercises: Stretches   Quadruped Mid Back Stretch 4 reps;30 seconds    Quadruped Mid Back Stretch Limitations needs verbal cues to get into position correctly      Lumbar Exercises: Seated   Other Seated Lumbar Exercises sitting 5 quick pelvic floor contraction with full relaxation;contract the pelvic floor for 8 seconds 8 times with counting out loud so she is breathing      Lumbar Exercises: Supine   Dead Bug 20 reps;1 second   each side   Dead Bug Limitations contracting the  pelvic floor      Lumbar Exercises: Quadruped   Single Arm Raise Right;Left;15 reps;1 second    Single Arm Raise Weights (lbs) with pelvic floor contraction    Straight Leg Raise 15 reps;1 second   right, left   Straight Leg Raises Limitations with pelvic floor contraction; tactile cues to keep spinal neutral                    PT Education - 05/18/21 1113     Education Details Access Code: [facility]    Person(s) Educated Patient    Methods Explanation;Demonstration;Handout    Comprehension Returned demonstration;Verbalized understanding              PT Short Term Goals - 04/20/21 0933       PT SHORT TERM GOAL #1   Title Patient will be independent with HEP     Time 4    Period Weeks    Status Achieved      PT SHORT TERM GOAL #2   Title understand what bladder irritants are and how they affect the bladder    Time 4    Period Weeks  Status Achieved               PT Long Term Goals - 05/18/21 1035       PT LONG TERM GOAL #2   Title able to walk to the bathroom without leaking urine due to improved strength of pelvic floor  >/= 3/5 strength holding for 10 seconds    Baseline when sits on the commode feels a pressure with  leakage    Time 4    Period Months    Status On-going      PT LONG TERM GOAL #3   Title understand the urge to void to suppress the need to urinate quickly but to walk slowly to the commode    Time 4    Period Weeks    Status Achieved      PT LONG TERM GOAL #4   Title minimal leakage with laughing and coughing due to improved pelvic floor contraction quickly and not hold her breath    Time 4    Period Months    Status Achieved                   Plan - 05/18/21 1113     Clinical Impression Statement Patient will leak urine when she gets to the commode and is starting to sit on the commode. Patient is not leaking with coughing, sneezing or laughing. Patient has progressed her exercise program. She needs tactile  cues to get into the childs pose position due to tightness in her shoulders. She needs tactile and verbal cues to keep spinal nuetral with quadruped lift an extremity. Patient is able to hole her pelvic floor contraction for 8 seconds and needs cues to count out loud so she is not holding her breath. Patient will benefit from skilled therapy to improve pelvic floor coordination and strength to reduce her leakage.    Personal Factors and Comorbidities Time since onset of injury/illness/exacerbation;Age;Fitness;Comorbidity 1    Comorbidities c section x1 and  episiotomy x4    Examination-Activity Limitations Continence;Toileting;Locomotion Level    Examination-Participation Restrictions Community Activity    Stability/Clinical Decision Making Stable/Uncomplicated    Rehab Potential Excellent    PT Frequency 1x / week    PT Duration Other (comment)   4 months   PT Treatment/Interventions ADLs/Self Care Home Management;Biofeedback;Therapeutic activities;Therapeutic exercise;Neuromuscular re-education;Patient/family education;Manual techniques;Dry needling    PT Next Visit Plan holding pelvic floor ocntraction for 10 seconds; review HEP and see if she could use a band    PT Home Exercise Plan Access Code: [facility]    Consulted and Agree with Plan of Care Patient;Other (Comment)   interpreter            Patient will benefit from skilled therapeutic intervention in order to improve the following deficits and impairments:  Decreased endurance, Decreased scar mobility, Decreased activity tolerance, Decreased strength, Increased fascial restricitons, Increased muscle spasms, Decreased coordination  Visit Diagnosis: Muscle weakness (generalized)  Other lack of coordination  Urge incontinence     Problem List Patient Active Problem List   Diagnosis Date Noted   Vitamin D insufficiency 12/09/2019   Left sided abdominal pain 07/15/2019   Seborrhea capitis in adult 07/15/2019   Alopecia of  scalp 07/15/2019   Neuropathy 07/15/2019   Essential hypertension 07/15/2019   Hyperlipidemia 07/15/2019   Language barrier to communication 09/21/2018   Pain in left foot 02/15/2017   Low back pain without sciatica 02/15/2017   Calculus of gallbladder and bile duct  w/o cholecystitis or obstruction 04/14/2014   Flatulence 09/11/2013   GERD (gastroesophageal reflux disease) 09/11/2013   Pain due to dental caries 07/18/2013   Acute pharyngitis 04/02/2013   Sinusitis, acute 04/02/2013   Streptococcal sore throat 04/02/2013   Other malaise and fatigue 04/02/2013    Earlie Counts, PT 05/18/21 11:17 AM  [facility] at [facility] [address], [place], [place], [address] Phone: [phone]   Fax:  [phone]  Name: Adie Hoecker MRN: [id number] Date of Birth: 04-22-1966

## 2021-05-25 ENCOUNTER — Other Ambulatory Visit: Payer: Self-pay

## 2021-05-25 ENCOUNTER — Encounter: Payer: Self-pay | Admitting: Physical Therapy

## 2021-05-25 ENCOUNTER — Encounter: Payer: Medicaid Other | Admitting: Physical Therapy

## 2021-05-25 DIAGNOSIS — R278 Other lack of coordination: Secondary | ICD-10-CM

## 2021-05-25 DIAGNOSIS — N3941 Urge incontinence: Secondary | ICD-10-CM | POA: Diagnosis not present

## 2021-05-25 DIAGNOSIS — M6281 Muscle weakness (generalized): Secondary | ICD-10-CM | POA: Diagnosis not present

## 2021-05-25 DIAGNOSIS — N393 Stress incontinence (female) (male): Secondary | ICD-10-CM | POA: Diagnosis not present

## 2021-05-25 NOTE — Patient Instructions (Signed)
Access Code: [facility] URL: [email or web address] Date: 05/25/2021 Prepared by: Earlie Counts  Exercises Seated Isometric Hip Adduction with Pelvic Floor Contraction - 1 x daily - 7 x weekly - 1 sets - 10 reps Seated Single Arm Reach Down with Trunk Rotation with PLB - 1 x daily - 7 x weekly - 2 sets - 10 reps Earlie Counts, PT [facility] [address] [place], [place] [address] W: [phone] [email or web address]

## 2021-05-25 NOTE — Therapy (Signed)
[place] at [facility] [address] [place], [place], [address] Phone: [phone]   Fax:  [phone]  Physical Therapy Treatment  Patient Details  Name: Lauren Petersen MRN: [id number] Date of Birth: [birth year] Referring Provider (PT): Dr. Caren Macadam   Encounter Date: 05/25/2021   PT End of Session - 05/25/21 1314     Visit Number 6    Date for PT Re-Evaluation 08/07/21    Authorization Type [employer]    Authorization Time Period 7/6-11/5    Authorization - Visit Number 5    Authorization - Number of Visits 12    PT Start Time 1300    PT Stop Time 1345    PT Time Calculation (min) 45 min    Activity Tolerance Patient tolerated treatment well    Behavior During Therapy [facility] for tasks assessed/performed             Past Medical History:  Diagnosis Date   Alopecia    Hyperlipidemia    Hypertension    Seborrhea capitis in adult 07/2019    Past Surgical History:  Procedure Laterality Date   CESAREAN SECTION      There were no vitals filed for this visit.   Subjective Assessment - 05/25/21 1305     Subjective No changes since lat visit. No leakage during the day. If she waits after she gets the urge, she is not able to hold the urine. No urinary leakage with sneeze or coughing.    Patient is accompained by: Interpreter    Patient Stated Goals reduce leakage    Currently in Pain? No/denies                [facility] PT Assessment - 05/25/21 0001       Assessment   Medical Diagnosis N39.3 Stress incontinence; N39.41 Urge incontinence    Referring Provider (PT) Dr. Caren Macadam    Onset Date/Surgical Date --   8 months ago   Prior Therapy not for pelvic floor      Precautions   Precautions None      Restrictions   Weight Bearing Restrictions No      [place] residence      Prior Function   Level of Independence Independent     Leisure no      Cognition   Overall Cognitive Status Within Functional Limits for tasks assessed      Observation/Other Assessments   Skin Integrity thickness on the distal half of the c-section scar      AROM   Overall AROM Comments lumbar ROM is full      PROM   Right Hip External Rotation  1    Left Hip External Rotation  1                        Pelvic Floor Special Questions - 05/25/21 0001     Urinary Leakage Yes    Activities that cause leaking With strong urge    Prolapse Uterine;Posterior Wall   you can see part of the posterior vaginal wall coming out of the vagina   Pelvic Floor Internal Exam Patient confirms identifitcaion and approves PT to assess pelvic floor and treatment    Exam Type Vaginal    Palpation more tightness on the left hand side of the introitus than the right;    Strength weak squeeze, no lift  light lift so 2+/5              [facility] Adult PT Treatment/Exercise - 05/25/21 0001       Neuro Re-ed    Neuro Re-ed Details  suoine with feet on bolster, resistive hip flexion with adduction and tactile cues to the pelvic floor to have one side contract more than the other 5 times on each side; resistive hip flexion 5 times each side ith tactile cues to contract the pelvic floor; pelvic floor contraction holding for 3 seconds with tactile cues and verbal cues to not hold breath.      Lumbar Exercises: Seated   Other Seated Lumbar Exercises ball squeeze with moving femur forward whild squeezing a ball and contracting the pelvic floor needing tactile cues to perform correctly; sitting moving both arms to the outside of the knee with a ball squeeze to contract the pelvic floor and working one side of the pelvic floor      Manual Therapy   Manual Therapy Internal Pelvic Floor    Internal Pelvic Floor manual work to the introitus and along the puborectalis and post. vaginal wall to improve mobility                    PT  Education - 05/25/21 1350     Education Details Access Code: [facility]    Person(s) Educated Patient;Other (comment)   interpreter   Methods Explanation;Demonstration;Handout    Comprehension Returned demonstration;Verbalized understanding              PT Short Term Goals - 04/20/21 0933       PT SHORT TERM GOAL #1   Title Patient will be independent with HEP     Time 4    Period Weeks    Status Achieved      PT SHORT TERM GOAL #2   Title understand what bladder irritants are and how they affect the bladder    Time 4    Period Weeks    Status Achieved               PT Long Term Goals - 05/25/21 1315       PT LONG TERM GOAL #1   Title Independent with advanced HEP for pelvic floor and core strength    Time 4    Period Months    Status On-going      PT LONG TERM GOAL #2   Title able to walk to the bathroom without leaking urine due to improved strength of pelvic floor  >/= 3/5 strength holding for 10 seconds    Time 4    Period Months    Status On-going      PT LONG TERM GOAL #3   Title understand the urge to void to suppress the need to urinate quickly but to walk slowly to the commode    Time 4    Period Weeks    Status Achieved      PT LONG TERM GOAL #4   Title minimal leakage with laughing and coughing due to improved pelvic floor contraction quickly and not hold her breath    Time 4    Period Months    Status Achieved                   Plan - 05/25/21 1315     Clinical Impression Statement Patient does not leak urine with coughing or sneezing. She only leaks urine as she is  walking to the commode after she has the urge to urinate. She is not able to delay the need to urinate. She is using the urge to void behavoral technique but still is having trouble. She has trouble contracting the left side of the pelvic floor and is tighter. Patient has the posterio vaginal wall coming out of the introitus. She says sometimes she has to press by the  vaginal to have a bowel movement. Patient needs verbal cues to not hold her breath with pelvic floor contraction. Patient has increased in bilateral hip external rotation to 5/5. Patient will benefit from skilled therapy to improve pelvic floor contraction and strength to reduce her leakage.    Personal Factors and Comorbidities Time since onset of injury/illness/exacerbation;Age;Fitness;Comorbidity 1    Comorbidities c section x1 and  episiotomy x4    Examination-Activity Limitations Continence;Toileting;Locomotion Level    Examination-Participation Restrictions Community Activity    Stability/Clinical Decision Making Stable/Uncomplicated    Rehab Potential Excellent    PT Frequency 1x / week    PT Duration Other (comment)   4 months   PT Treatment/Interventions ADLs/Self Care Home Management;Biofeedback;Therapeutic activities;Therapeutic exercise;Neuromuscular re-education;Patient/family education;Manual techniques;Dry needling    PT Next Visit Plan holding pelvic floor ocntraction for 10 seconds; review HEP and see if she could use a band contract one side strength    PT Home Exercise Plan Access Code: [facility]    Consulted and Agree with Plan of Care Patient;Other (Comment)   interpreter            Patient will benefit from skilled therapeutic intervention in order to improve the following deficits and impairments:  Decreased endurance, Decreased scar mobility, Decreased activity tolerance, Decreased strength, Increased fascial restricitons, Increased muscle spasms, Decreased coordination  Visit Diagnosis: Muscle weakness (generalized)  Other lack of coordination  Urge incontinence     Problem List Patient Active Problem List   Diagnosis Date Noted   Vitamin D insufficiency 12/09/2019   Left sided abdominal pain 07/15/2019   Seborrhea capitis in adult 07/15/2019   Alopecia of scalp 07/15/2019   Neuropathy 07/15/2019   Essential hypertension 07/15/2019   Hyperlipidemia  07/15/2019   Language barrier to communication 09/21/2018   Pain in left foot 02/15/2017   Low back pain without sciatica 02/15/2017   Calculus of gallbladder and bile duct w/o cholecystitis or obstruction 04/14/2014   Flatulence 09/11/2013   GERD (gastroesophageal reflux disease) 09/11/2013   Pain due to dental caries 07/18/2013   Acute pharyngitis 04/02/2013   Sinusitis, acute 04/02/2013   Streptococcal sore throat 04/02/2013   Other malaise and fatigue 04/02/2013    Earlie Counts, PT 05/25/21 2:02 PM  [facility] [address] [place], [place], [address] Phone: [phone]   Fax:  [phone]  Name: Dorit Banter MRN: [id number] Date of Birth: 12-02-1965

## 2021-06-01 ENCOUNTER — Encounter: Payer: Self-pay | Admitting: Physical Therapy

## 2021-06-01 ENCOUNTER — Other Ambulatory Visit: Payer: Self-pay

## 2021-06-01 ENCOUNTER — Encounter: Payer: Medicaid Other | Admitting: Physical Therapy

## 2021-06-01 DIAGNOSIS — N3941 Urge incontinence: Secondary | ICD-10-CM | POA: Diagnosis not present

## 2021-06-01 DIAGNOSIS — N393 Stress incontinence (female) (male): Secondary | ICD-10-CM | POA: Diagnosis not present

## 2021-06-01 DIAGNOSIS — M6281 Muscle weakness (generalized): Secondary | ICD-10-CM

## 2021-06-01 DIAGNOSIS — R278 Other lack of coordination: Secondary | ICD-10-CM | POA: Diagnosis not present

## 2021-06-01 NOTE — Therapy (Signed)
[place] at [facility] [address] [place], [place], [address] Phone: [phone]   Fax:  [phone]  Physical Therapy Treatment  Patient Details  Name: Lauren Petersen MRN: [id number] Date of Birth: 05-18-1966 Referring Provider (PT): Dr. Caren Macadam   Encounter Date: 06/01/2021   PT End of Session - 06/01/21 1348     Visit Number 7    Date for PT Re-Evaluation 08/07/21    Authorization Type [employer]    Authorization Time Period 7/6-11/5    Authorization - Visit Number 6    Authorization - Number of Visits 12    PT Start Time 1300    PT Stop Time 1345    PT Time Calculation (min) 45 min    Activity Tolerance Patient tolerated treatment well    Behavior During Therapy [facility] for tasks assessed/performed             Past Medical History:  Diagnosis Date   Alopecia    Hyperlipidemia    Hypertension    Seborrhea capitis in adult 07/2019    Past Surgical History:  Procedure Laterality Date   CESAREAN SECTION      There were no vitals filed for this visit.   Subjective Assessment - 06/01/21 1306     Subjective The urge to void is getting better.Lst week it only happened 1 time with leaking on the way to the commode and the same this week. Before therapy she would leak 4 times per week as she is walking tot he bathroom.    Patient is accompained by: Interpreter    Patient Stated Goals reduce leakage    Currently in Pain? No/denies                [facility] PT Assessment - 06/01/21 0001       PROM   Right Hip External Rotation  65    Left Hip External Rotation  65      Strength   Right Hip Extension 4+/5    Left Hip Extension 4+/5                           [facility] Adult PT Treatment/Exercise - 06/01/21 0001       Lumbar Exercises: Seated   Other Seated Lumbar Exercises seated ball squeeze with moving knee forward and back with tactile cues to not move the  trunk and squeeze the pelvic floor.; seated pelvic floor contraction holding for 10 sec 10x    Other Seated Lumbar Exercises sitting moving both arms to the outside of the knee with a ball squeeze to contract the pelvic floor and working one side of the pelvic floor with using a yellow band      Lumbar Exercises: Supine   Dead Bug 20 reps;1 second   holding 1 pound in each hand   Dead Bug Limitations contracting the pelvic floor    Bridge 15 reps    Bridge Limitations with shoulder extension with yellow band                    PT Education - 06/01/21 1348     Education Details Access Code: [facility]    Person(s) Educated Patient;Other (comment)   interpreter   Methods Explanation;Demonstration;Handout    Comprehension Verbalized understanding;Returned demonstration              PT Short Term Goals - 04/20/21 0141  PT SHORT TERM GOAL #1   Title Patient will be independent with HEP     Time 4    Period Weeks    Status Achieved      PT SHORT TERM GOAL #2   Title understand what bladder irritants are and how they affect the bladder    Time 4    Period Weeks    Status Achieved               PT Long Term Goals - 06/01/21 1352       PT LONG TERM GOAL #1   Title Independent with advanced HEP for pelvic floor and core strength    Baseline still learning new exercises    Time 4    Period Months    Status On-going      PT LONG TERM GOAL #2   Title able to walk to the bathroom without leaking urine due to improved strength of pelvic floor  >/= 3/5 strength holding for 10 seconds    Baseline 1 time per week now    Time 4    Period Months    Status On-going      PT LONG TERM GOAL #3   Title understand the urge to void to suppress the need to urinate quickly but to walk slowly to the commode    Time 4    Period Weeks    Status Achieved      PT LONG TERM GOAL #4   Title minimal leakage with laughing and coughing due to improved pelvic floor  contraction quickly and not hold her breath    Baseline does not leak when laugh or cough    Time 4    Period Months    Status Achieved                   Plan - 06/01/21 1349     Clinical Impression Statement Patient continues to not leak with coughing and sneezing. She will leak 1 time per week while walking to the bathroom compared to 4 times per week. Patient has full hip P/ROM for external rotation. She has increased bilateral hip extension to 4+/5. Patient has advanced her HEP to day. She is now holding pelvic floor contraction for 10 seconds. Patient will benefit from skilled therapy to improve pelvic floor contraction and strength to reduce leakage.    Personal Factors and Comorbidities Time since onset of injury/illness/exacerbation;Age;Fitness;Comorbidity 1    Comorbidities c section x1 and  episiotomy x4    Examination-Activity Limitations Continence;Toileting;Locomotion Level    Stability/Clinical Decision Making Stable/Uncomplicated    Rehab Potential Excellent    PT Frequency 1x / week    PT Duration Other (comment)   4 months   PT Treatment/Interventions ADLs/Self Care Home Management;Biofeedback;Therapeutic activities;Therapeutic exercise;Neuromuscular re-education;Patient/family education;Manual techniques;Dry needling    PT Next Visit Plan add bird dog; standing gluteus medius contraction    PT Home Exercise Plan Access Code: [facility]    Consulted and Agree with Plan of Care Patient;Other (Comment)   interpreter            Patient will benefit from skilled therapeutic intervention in order to improve the following deficits and impairments:  Decreased endurance, Decreased scar mobility, Decreased activity tolerance, Decreased strength, Increased fascial restricitons, Increased muscle spasms, Decreased coordination  Visit Diagnosis: Muscle weakness (generalized)  Other lack of coordination  Urge incontinence     Problem List Patient Active Problem  List   Diagnosis Date Noted  Vitamin D insufficiency 12/09/2019   Left sided abdominal pain 07/15/2019   Seborrhea capitis in adult 07/15/2019   Alopecia of scalp 07/15/2019   Neuropathy 07/15/2019   Essential hypertension 07/15/2019   Hyperlipidemia 07/15/2019   Language barrier to communication 09/21/2018   Pain in left foot 02/15/2017   Low back pain without sciatica 02/15/2017   Calculus of gallbladder and bile duct w/o cholecystitis or obstruction 04/14/2014   Flatulence 09/11/2013   GERD (gastroesophageal reflux disease) 09/11/2013   Pain due to dental caries 07/18/2013   Acute pharyngitis 04/02/2013   Sinusitis, acute 04/02/2013   Streptococcal sore throat 04/02/2013   Other malaise and fatigue 04/02/2013    Earlie Counts, PT 06/01/21 1:53 PM  [facility] [address] [place], [place], [address] Phone: [phone]   Fax:  [phone]  Name: Lauren Petersen MRN: [id number] Date of Birth: 07/06/1966

## 2021-06-01 NOTE — Patient Instructions (Signed)
Access Code: [facility] URL: [email or web address] Date: 06/01/2021 Prepared by: Earlie Counts  Exercises Child's Pose Stretch - 1 x daily - 7 x weekly - 1 sets - 2 reps - 30 sec hold Mini Squat - 1 x daily - 7 x weekly - 1 sets - 10 reps - 5 sec hold Seated Pelvic Floor Contraction - 3 x daily - 7 x weekly - 1 sets - 10 reps - 10 sec hold Functional Pelvic Floor Contractions with Cough Simulation - 1 x daily - 7 x weekly - 1 sets - 10 reps Seated Pelvic Floor Contraction - 4 x daily - 7 x weekly - 1 sets - 5 reps - 1 sec hold Dead Bug - 1 x daily - 4 x weekly - 1 sets - 10 reps Quadruped Exhale with Pelvic Floor Contraction and Arm Raise - 1 x daily - 4 x weekly - 1 sets - 10 reps Quadruped Leg Lifts - 1 x daily - 4 x weekly - 1 sets - 10 reps Seated Isometric Hip Adduction with Pelvic Floor Contraction - 1 x daily - 7 x weekly - 1 sets - 10 reps Seated Single Arm Reach Down with Trunk Rotation with PLB - 1 x daily - 4 x weekly - 2 sets - 10 reps Bridge with Shoulder Extension and Resistance - 1 x daily - 4 x weekly - 1 sets - 10 reps Earlie Counts, PT [facility] [address], [place], [place] [address] W: [phone] [email or web address]

## 2021-07-22 ENCOUNTER — Other Ambulatory Visit: Payer: Self-pay

## 2021-07-22 ENCOUNTER — Encounter: Payer: Self-pay | Admitting: Physical Therapy

## 2021-07-22 ENCOUNTER — Encounter: Payer: Medicaid Other | Attending: Family Medicine | Admitting: Physical Therapy

## 2021-07-22 DIAGNOSIS — N3941 Urge incontinence: Secondary | ICD-10-CM | POA: Diagnosis not present

## 2021-07-22 DIAGNOSIS — M6281 Muscle weakness (generalized): Secondary | ICD-10-CM | POA: Insufficient documentation

## 2021-07-22 DIAGNOSIS — R278 Other lack of coordination: Secondary | ICD-10-CM | POA: Insufficient documentation

## 2021-07-22 NOTE — Patient Instructions (Signed)
Access Code: [facility] URL: [email or web address] Date: 07/22/2021 Prepared by: Earlie Counts  Exercises Child's Pose Stretch - 1 x daily - 7 x weekly - 1 sets - 2 reps - 30 sec hold Seated Pelvic Floor Contraction - 3 x daily - 7 x weekly - 1 sets - 10 reps - 10 sec hold Seated Pelvic Floor Contraction - 4 x daily - 7 x weekly - 1 sets - 5 reps - 1 sec hold Bridge with Shoulder Extension and Resistance - 1 x daily - 4 x weekly - 1 sets - 10 reps Quadruped Pelvic Floor Contraction with Opposite Arm and Leg Lift - 1 x daily - 4 x weekly - 1 sets - 10 reps Squat with Chair Touch - 1 x daily - 4 x weekly - 1 sets - 10 reps Standing Pelvic Floor Contraction - 3 x daily - 7 x weekly - 1 sets - 5 reps - 5 sec hold  Earlie Counts, PT [facility] [address], [place], [place] [address] W: [phone] [email or web address]

## 2021-07-22 NOTE — Therapy (Signed)
[place] at [facility] [address], [place], [place], [address] Phone: [phone]   Fax:  [phone]  Physical Therapy Treatment  Patient Details  Name: Lauren Petersen MRN: [id number] Date of Birth: 10-02-1966 Referring Provider (PT): Dr. Caren Macadam   Encounter Date: 07/22/2021   PT End of Session - 07/22/21 1547     Visit Number 8    Date for PT Re-Evaluation 08/07/21    Authorization Type [employer]    Authorization Time Period 7/6-11/5    Authorization - Visit Number 7    Authorization - Number of Visits 12    PT Start Time 1500    PT Stop Time 1540    PT Time Calculation (min) 40 min    Activity Tolerance Patient tolerated treatment well    Behavior During Therapy [facility] for tasks assessed/performed             Past Medical History:  Diagnosis Date   Alopecia    Hyperlipidemia    Hypertension    Seborrhea capitis in adult 07/2019    Past Surgical History:  Procedure Laterality Date   CESAREAN SECTION      There were no vitals filed for this visit.   Subjective Assessment - 07/22/21 1505     Subjective I still have the symptoms. Yesterday held her urine for 3 hours. She sneezed and a drop of urine came out. Patient can hold urine 3-4 hours. $times since she saw the therapist she has dripped prior to sitting on the commode. Does not wear pads during the day.    Patient Stated Goals reduce leakage    Currently in Pain? No/denies    Multiple Pain Sites No                [facility] PT Assessment - 07/22/21 0001       Strength   Right Hip Extension 5/5    Left Hip Extension 5/5                        Pelvic Floor Special Questions - 07/22/21 0001     Activities that cause leaking With strong urge    Urinary urgency No               [facility] Adult PT Treatment/Exercise - 07/22/21 0001       Lumbar Exercises: Seated   Other Seated Lumbar Exercises sit  to stand holding red band on one side 10x then 10x on the other side    Other Seated Lumbar Exercises seated pelvic floor contraction holding for 10 sec 5x, then standing pelvic floor contraction holding for 5 sec 5 times followed by 5 quick flicks      Lumbar Exercises: Supine   Bridge with March 10 reps;1 second   each leg   Bridge with [employer] Limitations with pelvic floor contraction and bilateral shoulder extension with red band      Lumbar Exercises: Quadruped   Opposite Arm/Leg Raise Right arm/Left leg;Left arm/Right leg;15 reps;1 second    Opposite Arm/Leg Raise Limitations with pelvic floor contraction                     PT Education - 07/22/21 1546     Education Details Access Code: [facility]    Person(s) Educated Patient;Other (comment)   interpreter   Methods Explanation;Demonstration;Verbal cues;Handout    Comprehension Returned demonstration;Verbalized understanding  PT Short Term Goals - 04/20/21 0933       PT SHORT TERM GOAL #1   Title Patient will be independent with HEP     Time 4    Period Weeks    Status Achieved      PT SHORT TERM GOAL #2   Title understand what bladder irritants are and how they affect the bladder    Time 4    Period Weeks    Status Achieved               PT Long Term Goals - 07/22/21 1551       PT LONG TERM GOAL #1   Title Independent with advanced HEP for pelvic floor and core strength    Baseline still learning new exercises    Time 4    Period Months    Status On-going      PT LONG TERM GOAL #2   Title able to walk to the bathroom without leaking urine due to improved strength of pelvic floor  >/= 3/5 strength holding for 10 seconds    Baseline 4 times per month    Time 4    Period Months    Status On-going      PT LONG TERM GOAL #3   Title understand the urge to void to suppress the need to urinate quickly but to walk slowly to the commode    Baseline educated    Time 4     Period Weeks    Status Achieved      PT LONG TERM GOAL #4   Title minimal leakage with laughing and coughing due to improved pelvic floor contraction quickly and not hold her breath    Baseline does not leak when laugh or cough    Time 4    Period Months    Status Achieved                   Plan - 07/22/21 1547     Clinical Impression Statement Patient reports she is not having falling out feeling anymore. She is able to hold her urine for 4 hours. She has dribbled walking to the commode 4 times this month. Patient is not having urgency. She has increased in bilaeral hip extension to 5/5. Patient is now able to do the bird dog instead of lifting one extremity at a time with neutral spine. Her exercise program was advanced to increase difficulty due to her doing better. Patient will benefit from skilled therapy to improve pelvci floor contraction and strength to reduce leakage.    Personal Factors and Comorbidities Time since onset of injury/illness/exacerbation;Age;Fitness;Comorbidity 1    Comorbidities c section x1 and  episiotomy x4    Examination-Activity Limitations Continence;Toileting;Locomotion Level    Examination-Participation Restrictions Community Activity    Stability/Clinical Decision Making Stable/Uncomplicated    Rehab Potential Excellent    PT Frequency 1x / week    PT Duration Other (comment)   4 months   PT Treatment/Interventions ADLs/Self Care Home Management;Biofeedback;Therapeutic activities;Therapeutic exercise;Neuromuscular re-education;Patient/family education;Manual techniques;Dry needling    PT Next Visit Plan gluteus medius, cross body exercise, quick onctraction with movement; possible discharge    PT Home Exercise Plan Access Code: [facility]    Consulted and Agree with Plan of Care Patient;Other (Comment)   interpreter            Patient will benefit from skilled therapeutic intervention in order to improve the following deficits and  impairments:  Decreased  endurance, Decreased scar mobility, Decreased activity tolerance, Decreased strength, Increased fascial restricitons, Increased muscle spasms, Decreased coordination  Visit Diagnosis: Muscle weakness (generalized)  Other lack of coordination  Urge incontinence     Problem List Patient Active Problem List   Diagnosis Date Noted   Vitamin D insufficiency 12/09/2019   Left sided abdominal pain 07/15/2019   Seborrhea capitis in adult 07/15/2019   Alopecia of scalp 07/15/2019   Neuropathy 07/15/2019   Essential hypertension 07/15/2019   Hyperlipidemia 07/15/2019   Language barrier to communication 09/21/2018   Pain in left foot 02/15/2017   Low back pain without sciatica 02/15/2017   Calculus of gallbladder and bile duct w/o cholecystitis or obstruction 04/14/2014   Flatulence 09/11/2013   GERD (gastroesophageal reflux disease) 09/11/2013   Pain due to dental caries 07/18/2013   Acute pharyngitis 04/02/2013   Sinusitis, acute 04/02/2013   Streptococcal sore throat 04/02/2013   Other malaise and fatigue 04/02/2013    Earlie Counts, PT 07/22/21 3:52 PM  [facility] [address], [place], [place], [address] Phone: [phone]   Fax:  [phone]  Name: Aniya Jolicoeur MRN: [id number] Date of Birth: 1966-04-03

## 2021-07-29 DIAGNOSIS — Z0389 Encounter for observation for other suspected diseases and conditions ruled out: Secondary | ICD-10-CM | POA: Diagnosis not present

## 2021-07-29 DIAGNOSIS — N760 Acute vaginitis: Secondary | ICD-10-CM | POA: Diagnosis not present

## 2021-07-29 DIAGNOSIS — Z114 Encounter for screening for human immunodeficiency virus [HIV]: Secondary | ICD-10-CM | POA: Diagnosis not present

## 2021-07-29 DIAGNOSIS — Z113 Encounter for screening for infections with a predominantly sexual mode of transmission: Secondary | ICD-10-CM | POA: Diagnosis not present

## 2021-08-03 ENCOUNTER — Encounter: Payer: Self-pay | Admitting: Physical Therapy

## 2021-08-03 ENCOUNTER — Encounter: Payer: Medicaid Other | Attending: Family Medicine | Admitting: Physical Therapy

## 2021-08-03 ENCOUNTER — Other Ambulatory Visit: Payer: Self-pay

## 2021-08-03 DIAGNOSIS — N3941 Urge incontinence: Secondary | ICD-10-CM | POA: Diagnosis not present

## 2021-08-03 DIAGNOSIS — R278 Other lack of coordination: Secondary | ICD-10-CM | POA: Diagnosis not present

## 2021-08-03 DIAGNOSIS — M6281 Muscle weakness (generalized): Secondary | ICD-10-CM | POA: Diagnosis not present

## 2021-08-03 NOTE — Therapy (Signed)
[place] at [facility] [address] [place], [place], [address] Phone: [phone]   Fax:  [phone]  Physical Therapy Treatment  Patient Details  Name: Lauren Petersen MRN: [id number] Date of Birth: May 24, 1966 Referring Provider (PT): Dr. Caren Macadam   Encounter Date: 08/03/2021   PT End of Session - 08/03/21 1445     Visit Number 9    Date for PT Re-Evaluation 08/07/21    Authorization Type [employer]    Authorization Time Period 7/6-11/5    Authorization - Visit Number 9    Authorization - Number of Visits 12    PT Start Time 1400    PT Stop Time 1440    PT Time Calculation (min) 40 min    Activity Tolerance Patient tolerated treatment well    Behavior During Therapy [facility] for tasks assessed/performed             Past Medical History:  Diagnosis Date   Alopecia    Hyperlipidemia    Hypertension    Seborrhea capitis in adult 07/2019    Past Surgical History:  Procedure Laterality Date   CESAREAN SECTION      There were no vitals filed for this visit.   Subjective Assessment - 08/03/21 1404     Subjective I am good.    Patient is accompained by: Interpreter    Patient Stated Goals reduce leakage    Currently in Pain? No/denies    Multiple Pain Sites No                [facility] PT Assessment - 08/03/21 0001       Assessment   Medical Diagnosis N39.3 Stress incontinence; N39.41 Urge incontinence    Referring Provider (PT) Dr. Caren Macadam    Onset Date/Surgical Date --   8 months ago   Prior Therapy not for pelvic floor      Precautions   Precautions None      Restrictions   Weight Bearing Restrictions No      [place] residence      Prior Function   Level of Independence Independent    Leisure no      Cognition   Overall Cognitive Status Within Functional Limits for tasks assessed      Observation/Other  Assessments   Skin Integrity thickness on the distal half of the c-section scar                        Pelvic Floor Special Questions - 08/03/21 0001     Number of Pregnancies 5    Number of C-Sections 1    Number of Vaginal Deliveries 4    Diastasis Recti none    Currently Sexually Active No    Is this Painful No    Urinary Leakage No    Urinary urgency No    Fecal incontinence No    Falling out feeling (prolapse) No    Exam Type Deferred    Strength fair squeeze, definite lift   light lift              [facility] Adult PT Treatment/Exercise - 08/03/21 0001       Lumbar Exercises: Supine   Bridge 15 reps    Bridge Limitations with shoulder extension with yellow band      Lumbar Exercises: Quadruped   Opposite Arm/Leg Raise Right arm/Left leg;Left arm/Right  leg;15 reps;1 second    Opposite Arm/Leg Raise Limitations with pelvic floor contraction      Manual Therapy   Manual Therapy Myofascial release;Soft tissue mobilization    Soft tissue mobilization scar mobilization along the suprapubic area    Myofascial Release fascial release along the puboviseical ligaments, alon gth esuprapubic area and sides of the bladder to release fascial restrictions                       PT Short Term Goals - 08/03/21 1404       PT SHORT TERM GOAL #1   Title Patient will be independent with HEP     Time 4    Period Weeks    Status Achieved    Target Date 05/04/21      PT SHORT TERM GOAL #2   Title understand what bladder irritants are and how they affect the bladder    Time 4    Period Weeks    Status Achieved               PT Long Term Goals - 08/03/21 1405       PT LONG TERM GOAL #1   Title Independent with advanced HEP for pelvic floor and core strength    Time 4    Period Months    Status Achieved      PT LONG TERM GOAL #2   Title able to walk to the bathroom without leaking urine due to improved strength of pelvic floor  >/= 3/5  strength holding for 10 seconds    Time 4    Period Months    Status Achieved      PT LONG TERM GOAL #3   Title understand the urge to void to suppress the need to urinate quickly but to walk slowly to the commode    Time 4    Period Weeks    Status Achieved      PT LONG TERM GOAL #4   Title minimal leakage with laughing and coughing due to improved pelvic floor contraction quickly and not hold her breath    Time 4    Period Months    Status Achieved                   Plan - 08/03/21 1445     Clinical Impression Statement Patient is able to walk to the bathroom without leaking urine. Patient is not having urgency. She is independent with her HEP. She is able to use the urge to void behavioral technique. She has increased strengt hof the pelvic floor muscles. she has minimal restrictions in her c-section scar. Patient is ready for discharge.    Personal Factors and Comorbidities Time since onset of injury/illness/exacerbation;Age;Fitness;Comorbidity 1    Comorbidities c section x1 and  episiotomy x4    Examination-Activity Limitations Continence;Toileting;Locomotion Level    Stability/Clinical Decision Making Stable/Uncomplicated    Rehab Potential Excellent    PT Treatment/Interventions ADLs/Self Care Home Management;Biofeedback;Therapeutic activities;Therapeutic exercise;Neuromuscular re-education;Patient/family education;Manual techniques;Dry needling    PT Next Visit Plan Discharge to HEP    PT Home Exercise Plan Access Code: [facility]    Consulted and Agree with Plan of Care Patient;Other (Comment)   interpreter            Patient will benefit from skilled therapeutic intervention in order to improve the following deficits and impairments:  Decreased endurance, Decreased scar mobility, Decreased activity tolerance, Decreased strength, Increased fascial restricitons,  Increased muscle spasms, Decreased coordination  Visit Diagnosis: Muscle weakness  (generalized)  Other lack of coordination  Urge incontinence     Problem List Patient Active Problem List   Diagnosis Date Noted   Vitamin D insufficiency 12/09/2019   Left sided abdominal pain 07/15/2019   Seborrhea capitis in adult 07/15/2019   Alopecia of scalp 07/15/2019   Neuropathy 07/15/2019   Essential hypertension 07/15/2019   Hyperlipidemia 07/15/2019   Language barrier to communication 09/21/2018   Pain in left foot 02/15/2017   Low back pain without sciatica 02/15/2017   Calculus of gallbladder and bile duct w/o cholecystitis or obstruction 04/14/2014   Flatulence 09/11/2013   GERD (gastroesophageal reflux disease) 09/11/2013   Pain due to dental caries 07/18/2013   Acute pharyngitis 04/02/2013   Sinusitis, acute 04/02/2013   Streptococcal sore throat 04/02/2013   Other malaise and fatigue 04/02/2013    Earlie Counts, PT 08/03/21 2:49 PM  [place] at [facility] [address], [place], [place], [address] Phone: [phone]   Fax:  [phone]  Name: Lauren Petersen MRN: [id number] Date of Birth: [birth year]  PHYSICAL THERAPY DISCHARGE SUMMARY  Visits from Start of Care: 9  Current functional level related to goals / functional outcomes: See above.    Remaining deficits: See above.    Education / Equipment: HEP   Patient agrees to discharge. Patient goals were met. Patient is being discharged due to meeting the stated rehab goals. Thank you for the referral. Earlie Counts, PT 08/03/21 2:49 PM

## 2021-08-18 ENCOUNTER — Other Ambulatory Visit: Payer: Self-pay

## 2021-08-18 ENCOUNTER — Encounter: Payer: Self-pay | Admitting: Nurse Practitioner

## 2021-08-18 ENCOUNTER — Ambulatory Visit: Payer: Medicaid Other | Admitting: Nurse Practitioner

## 2021-08-18 VITALS — BP 136/69 | HR 72 | Temp 97.3°F | Ht 59.0 in | Wt 138.6 lb

## 2021-08-18 DIAGNOSIS — Z0000 Encounter for general adult medical examination without abnormal findings: Secondary | ICD-10-CM

## 2021-08-18 DIAGNOSIS — R7303 Prediabetes: Secondary | ICD-10-CM

## 2021-08-18 DIAGNOSIS — E876 Hypokalemia: Secondary | ICD-10-CM | POA: Diagnosis not present

## 2021-08-18 DIAGNOSIS — I10 Essential (primary) hypertension: Secondary | ICD-10-CM | POA: Diagnosis not present

## 2021-08-18 DIAGNOSIS — Z5320 Procedure and treatment not carried out because of patient's decision for unspecified reasons: Secondary | ICD-10-CM | POA: Diagnosis not present

## 2021-08-18 DIAGNOSIS — Z131 Encounter for screening for diabetes mellitus: Secondary | ICD-10-CM | POA: Diagnosis not present

## 2021-08-18 LAB — POCT URINALYSIS DIP (CLINITEK)
Bilirubin, UA: NEGATIVE
Blood, UA: NEGATIVE
Glucose, UA: NEGATIVE mg/dL
Ketones, POC UA: NEGATIVE mg/dL
Nitrite, UA: NEGATIVE
POC PROTEIN,UA: NEGATIVE
Spec Grav, UA: 1.02 (ref 1.010–1.025)
Urobilinogen, UA: 0.2 E.U./dL
pH, UA: 7.5 (ref 5.0–8.0)

## 2021-08-18 LAB — POCT GLYCOSYLATED HEMOGLOBIN (HGB A1C)
HbA1c POC (<> result, manual entry): 5.4 % (ref 4.0–5.6)
HbA1c, POC (controlled diabetic range): 5.4 % (ref 0.0–7.0)
HbA1c, POC (prediabetic range): 5.4 % — AB (ref 5.7–6.4)
Hemoglobin A1C: 5.4 % (ref 4.0–5.6)

## 2021-08-18 NOTE — Progress Notes (Signed)
[place], [place]  [address] Phone:  [phone]   Fax:  [phone]   Established Patient Office Visit  Subjective:  Patient ID: Lauren Petersen, female    DOB: 1966/04/04  Age: 55 y.o. MRN: [id number]  CC:  Chief Complaint  Patient presents with   Follow-up    Pt is here today for her regular check up/No PAP smear    HPI Lauren Petersen presents for follow up. She  has a past medical history of Alopecia, Hyperlipidemia, Hypertension, and Seborrhea capitis in adult (07/2019).   She is in today for follow up. She was schedule for pap but declined. She has occasional headache no treatment. Denies headache, dizziness, visual changes, shortness of breath, dyspnea on exertion, chest pain, nausea, vomiting or any edema.   She does feel like the oxybutynin is effective. She takes vitamins.  She would like to stop the OC. She reports that her LMP ws 13 months ago. She was taking this to prevent hair loss.   Past Medical History:  Diagnosis Date   Alopecia    Hyperlipidemia    Hypertension    Seborrhea capitis in adult 07/2019    Past Surgical History:  Procedure Laterality Date   CESAREAN SECTION      History reviewed. No pertinent family history.  Social History   Socioeconomic History   Marital status: Married    Spouse name: Not on file   Number of children: Not on file   Years of education: Not on file   Highest education level: Not on file  Occupational History   Not on file  Tobacco Use   Smoking status: Never   Smokeless tobacco: Never  Vaping Use   Vaping Use: Never used  Substance and Sexual Activity   Alcohol use: No   Drug use: No   Sexual activity: Yes    Birth control/protection: I.U.D.  Other Topics Concern   Not on file  Social History Narrative   Not on file   Social Determinants of Health   Financial Resource Strain: Not on file  Food Insecurity: No Food Insecurity   Worried About  Running Out of Food in the Last Year: Never true   Ran Out of Food in the Last Year: Never true  Transportation Needs: No Transportation Needs   Lack of Transportation (Medical): No   Lack of Transportation (Non-Medical): No  Physical Activity: Not on file  Stress: Not on file  Social Connections: Not on file  Intimate Partner Violence: Not on file    Outpatient Medications Prior to Visit  Medication Sig Dispense Refill   cholecalciferol (VITAMIN D3) 25 MCG (1000 UNIT) tablet Take 1,000 Units by mouth daily.     DERMA-SMOOTHE/FS SCALP 0.01 % OIL APPLY A SMALL AMOUNT TO AFFECTED AREA ON SCALP EVERY NIGHT     hydrochlorothiazide (HYDRODIURIL) 25 MG tablet Take 1 tablet (25 mg total) by mouth daily. 90 tablet 3   oxybutynin (DITROPAN XL) 10 MG 24 hr tablet Take 1 tablet (10 mg total) by mouth at bedtime. 30 tablet 6   selenium sulfide (SELSUN) 2.5 % shampoo Apply topically daily as needed. 118 mL 11   sildenafil (REVATIO) 20 MG tablet Take 1 tablet (20 mg total) by mouth 3 (three) times daily. 10 tablet 5   spironolactone (ALDACTONE) 50 MG tablet Take 50 mg by mouth daily.     norethindrone (MICRONOR,CAMILA,ERRIN) 0.35 MG tablet Take 1 tablet by mouth  daily.     Multiple Vitamin (MULTIVITAMIN PO) Take by mouth. (Patient not taking: Reported on 08/18/2021)     potassium chloride (KLOR-CON) 10 MEQ tablet Take 1 tablet (10 mEq total) by mouth daily. 30 tablet 0   No facility-administered medications prior to visit.    No Known Allergies  ROS Review of Systems    Objective:    Physical Exam Constitutional:      General: She is not in acute distress.    Appearance: She is not ill-appearing or toxic-appearing.  HENT:     Head: Normocephalic and atraumatic.     Nose: Nose normal.  Cardiovascular:     Rate and Rhythm: Normal rate and regular rhythm.     Pulses: Normal pulses.     Heart sounds: Normal heart sounds.  Pulmonary:     Effort: Pulmonary effort is normal.     Breath  sounds: Normal breath sounds.  Abdominal:     Palpations: Abdomen is soft.  Musculoskeletal:        General: Normal range of motion.     Cervical back: Normal range of motion.  Skin:    General: Skin is warm and dry.     Capillary Refill: Capillary refill takes less than 2 seconds.  Neurological:     General: No focal deficit present.     Mental Status: She is alert and oriented to person, place, and time.  Psychiatric:        Mood and Affect: Mood normal.        Behavior: Behavior normal.        Thought Content: Thought content normal.        Judgment: Judgment normal.    BP 136/69   Pulse 72   Temp (!) 97.3 F (36.3 C)   Ht 4' 11"  (1.499 m)   Wt 138 lb 9.6 oz (62.9 kg)   LMP 07/05/2020 (Exact Date)   SpO2 99%   BMI 27.99 kg/m  Wt Readings from Last 3 Encounters:  08/18/21 138 lb 9.6 oz (62.9 kg)  05/17/21 137 lb 0.6 oz (62.2 kg)  02/08/21 135 lb 0.4 oz (61.2 kg)     Health Maintenance Due  Topic Date Due   PAP SMEAR-Modifier  10/17/2016   INFLUENZA VACCINE  05/03/2021    There are no preventive care reminders to display for this patient.  Lab Results  Component Value Date   TSH 1.170 12/06/2019   Lab Results  Component Value Date   WBC 5.1 12/06/2019   HGB 14.8 12/06/2019   HCT 43.9 12/06/2019   MCV 90 12/06/2019   PLT 259 12/06/2019   Lab Results  Component Value Date   NA 141 05/17/2021   K 3.8 05/17/2021   CO2 24 09/21/2018   GLUCOSE 89 05/17/2021   BUN 13 05/17/2021   CREATININE 0.78 05/17/2021   BILITOT 0.3 05/17/2021   ALKPHOS 69 05/17/2021   AST 14 05/17/2021   ALT 9 12/26/2014   PROT 7.4 05/17/2021   ALBUMIN 4.5 05/17/2021   CALCIUM 10.0 05/17/2021   EGFR 91 05/17/2021   Lab Results  Component Value Date   CHOL 146 12/06/2019   Lab Results  Component Value Date   HDL 45 12/06/2019   Lab Results  Component Value Date   LDLCALC 79 12/06/2019   Lab Results  Component Value Date   TRIG 126 12/06/2019   Lab Results   Component Value Date   CHOLHDL 3.2 12/06/2019   Lab Results  Component Value Date   HGBA1C 5.4 08/18/2021   HGBA1C 5.4 08/18/2021   HGBA1C 5.4 (A) 08/18/2021   HGBA1C 5.4 08/18/2021      Assessment & Plan:   Problem List Items Addressed This Visit       Cardiovascular and Mediastinum   Essential hypertension - Primary Stable Encouraged on going compliance with current medication regimen Encouraged home monitoring and recording BP <130/80 Eating a heart-healthy diet with less salt Encouraged regular physical activity  Recommend Weight loss   Relevant Medications   spironolactone (ALDACTONE) 50 MG tablet   Other Visit Diagnoses     Healthcare maintenance       Relevant Orders   POCT URINALYSIS DIP (CLINITEK) (Completed)   Prediabetes    Current A1c 5.4%   Papanicolaou smear declined           No orders of the defined types were placed in this encounter.   Follow-up: Return in about 6 months (around 02/15/2022) for Follow up HTN [id number].    Vevelyn Francois, NP

## 2021-08-18 NOTE — Patient Instructions (Addendum)
Multivitamin; One a day for women over 50.  Calcium  Hair skin and nail vitamin maybe of benefit for the hair changes  Preventive Care 41-53 Years Old, Female Preventive care refers to lifestyle choices and visits with your health care provider that can promote health and wellness. Preventive care visits are also called wellness exams. What can I expect for my preventive care visit? Counseling Your health care provider may ask you questions about your: Medical history, including: Past medical problems. Family medical history. Pregnancy history. Current health, including: Menstrual cycle. Method of birth control. Emotional well-being. Home life and relationship well-being. Sexual activity and sexual health. Lifestyle, including: Alcohol, nicotine or tobacco, and drug use. Access to firearms. Diet, exercise, and sleep habits. Work and work Statistician. Sunscreen use. Safety issues such as seatbelt and bike helmet use. Physical exam Your health care provider will check your: Height and weight. These may be used to calculate your BMI (body mass index). BMI is a measurement that tells if you are at a healthy weight. Waist circumference. This measures the distance around your waistline. This measurement also tells if you are at a healthy weight and may help predict your risk of certain diseases, such as type 2 diabetes and high blood pressure. Heart rate and blood pressure. Body temperature. Skin for abnormal spots. What immunizations do I need? Vaccines are usually given at various ages, according to a schedule. Your health care provider will recommend vaccines for you based on your age, medical history, and lifestyle or other factors, such as travel or where you work. What tests do I need? Screening Your health care provider may recommend screening tests for certain conditions. This may include: Lipid and cholesterol levels. Diabetes screening. This is done by checking your blood  sugar (glucose) after you have not eaten for a while (fasting). Pelvic exam and Pap test. Hepatitis B test. Hepatitis C test. HIV (human immunodeficiency virus) test. STI (sexually transmitted infection) testing, if you are at risk. Lung cancer screening. Colorectal cancer screening. Mammogram. Talk with your health care provider about when you should start having regular mammograms. This may depend on whether you have a family history of breast cancer. BRCA-related cancer screening. This may be done if you have a family history of breast, ovarian, tubal, or peritoneal cancers. Bone density scan. This is done to screen for osteoporosis. Talk with your health care provider about your test results, treatment options, and if necessary, the need for more tests. Follow these instructions at home: Eating and drinking  Eat a diet that includes fresh fruits and vegetables, whole grains, lean protein, and low-fat dairy products. Take vitamin and mineral supplements as recommended by your health care provider. Do not drink alcohol if: Your health care provider tells you not to drink. You are pregnant, may be pregnant, or are planning to become pregnant. If you drink alcohol: Limit how much you have to 0-1 drink a day. Know how much alcohol is in your drink. In the [place] one drink equals one 12 oz bottle of beer (355 mL), one 5 oz glass of wine (148 mL), or one 1 oz glass of hard liquor (44 mL). Lifestyle Brush your teeth every morning and night with fluoride toothpaste. Floss one time each day. Exercise for at least 30 minutes 5 or more days each week. Do not use any products that contain nicotine or tobacco. These products include cigarettes, chewing tobacco, and vaping devices, such as e-cigarettes. If you need help quitting, ask your health  care provider. Do not use drugs. If you are sexually active, practice safe sex. Use a condom or other form of protection to prevent STIs. If you do not  wish to become pregnant, use a form of birth control. If you plan to become pregnant, see your health care provider for a prepregnancy visit. Take aspirin only as told by your health care provider. Make sure that you understand how much to take and what form to take. Work with your health care provider to find out whether it is safe and beneficial for you to take aspirin daily. Find healthy ways to manage stress, such as: Meditation, yoga, or listening to music. Journaling. Talking to a trusted person. Spending time with friends and family. Minimize exposure to UV radiation to reduce your risk of skin cancer. Safety Always wear your seat belt while driving or riding in a vehicle. Do not drive: If you have been drinking alcohol. Do not ride with someone who has been drinking. When you are tired or distracted. While texting. If you have been using any mind-altering substances or drugs. Wear a helmet and other protective equipment during sports activities. If you have firearms in your house, make sure you follow all gun safety procedures. Seek help if you have been physically or sexually abused. What's next? Visit your health care provider once a year for an annual wellness visit. Ask your health care provider how often you should have your eyes and teeth checked. Stay up to date on all vaccines. This information is not intended to replace advice given to you by your health care provider. Make sure you discuss any questions you have with your health care provider. Document Revised: 03/17/2021 Document Reviewed: 03/17/2021 Elsevier Patient Education  Central.

## 2021-11-22 ENCOUNTER — Other Ambulatory Visit: Payer: Self-pay | Admitting: Nurse Practitioner

## 2021-11-22 DIAGNOSIS — Z1231 Encounter for screening mammogram for malignant neoplasm of breast: Secondary | ICD-10-CM

## 2021-12-09 ENCOUNTER — Ambulatory Visit
Admission: RE | Admit: 2021-12-09 | Discharge: 2021-12-09 | Disposition: A | Discharge status: Home / Self Care | Payer: Medicaid Other | Source: Ambulatory Visit | Attending: Nurse Practitioner | Admitting: Nurse Practitioner

## 2021-12-09 DIAGNOSIS — Z1231 Encounter for screening mammogram for malignant neoplasm of breast: Secondary | ICD-10-CM | POA: Diagnosis not present

## 2022-02-16 ENCOUNTER — Ambulatory Visit: Payer: Medicaid Other | Admitting: Nurse Practitioner

## 2022-02-16 ENCOUNTER — Encounter: Payer: Self-pay | Admitting: Nurse Practitioner

## 2022-02-16 VITALS — BP 128/53 | HR 67 | Temp 98.2°F | Ht 59.0 in | Wt 138.2 lb

## 2022-02-16 DIAGNOSIS — E559 Vitamin D deficiency, unspecified: Secondary | ICD-10-CM | POA: Diagnosis not present

## 2022-02-16 DIAGNOSIS — I10 Essential (primary) hypertension: Secondary | ICD-10-CM

## 2022-02-16 DIAGNOSIS — N3941 Urge incontinence: Secondary | ICD-10-CM

## 2022-02-16 MED ORDER — HYDROCHLOROTHIAZIDE 25 MG PO TABS: 25.0000 mg | ORAL_TABLET | Freq: Every day | ORAL | 3 refills | Status: DC

## 2022-02-16 MED ORDER — DERMA-SMOOTHE/FS SCALP 0.01 % EX OIL: TOPICAL_OIL | CUTANEOUS | 0 refills | Status: DC

## 2022-02-16 MED ORDER — SPIRONOLACTONE 50 MG PO TABS: 50.0000 mg | ORAL_TABLET | Freq: Every day | ORAL | 2 refills | Status: DC

## 2022-02-16 MED ORDER — OXYBUTYNIN CHLORIDE ER 10 MG PO TB24: 10.0000 mg | ORAL_TABLET | Freq: Every day | ORAL | 6 refills | Status: DC

## 2022-02-16 NOTE — Patient Instructions (Addendum)
1. Urge incontinence ? ?- oxybutynin (DITROPAN XL) 10 MG 24 hr tablet; Take 1 tablet (10 mg total) by mouth at bedtime.  Dispense: 30 tablet; Refill: 6 ?- Ambulatory referral to Urology ?- CBC ?- Comprehensive metabolic panel ? ?2. Essential hypertension ? ?- spironolactone (ALDACTONE) 50 MG tablet; Take 1 tablet (50 mg total) by mouth daily.  Dispense: 30 tablet; Refill: 2 ?- hydrochlorothiazide (HYDRODIURIL) 25 MG tablet; Take 1 tablet (25 mg total) by mouth daily.  Dispense: 90 tablet; Refill: 3 ?- CBC ?- Comprehensive metabolic panel ? ? ? ?Follow up: ? ?Follow up in 6 months for physical ?

## 2022-02-16 NOTE — Assessment & Plan Note (Signed)
-   oxybutynin (DITROPAN XL) 10 MG 24 hr tablet; Take 1 tablet (10 mg total) by mouth at bedtime.  Dispense: 30 tablet; Refill: 6 ?- Ambulatory referral to Urology ?- CBC ?- Comprehensive metabolic panel ? ?2. Essential hypertension ? ?- spironolactone (ALDACTONE) 50 MG tablet; Take 1 tablet (50 mg total) by mouth daily.  Dispense: 30 tablet; Refill: 2 ?- hydrochlorothiazide (HYDRODIURIL) 25 MG tablet; Take 1 tablet (25 mg total) by mouth daily.  Dispense: 90 tablet; Refill: 3 ?- CBC ?- Comprehensive metabolic panel ? ? ? ?Follow up: ? ?Follow up in 6 months for physical ?

## 2022-02-16 NOTE — Progress Notes (Signed)
$'@Patient'O$  ID: Lauren Petersen, female    DOB: January 19, 1966, 56 y.o.   MRN: [id number] ? ?Chief Complaint  ?Patient presents with  ? Follow-up  ?  Pt is here for 6 months BP follow up visit. Pt stated she has lack of energy and fatigue Pt requesting refill on medications DERMA-SMOOTHE/FS SCALP 0.01 % OIL, hydrochlorothiazide,oxybutynin, spironolactone. Pt is still concern about hair  Alopecia.  ? ? ?Referring provider: ?Vevelyn Francois, NP ? ? ?HPI ? ?Lauren Petersen presents for follow up. She  has a past medical history of Alopecia, Hyperlipidemia, Hypertension, and Seborrhea capitis in adult (07/2019).  ? ?Patient presents today for a follow-up on hypertension.  Blood pressure was good in office today.  Patient is compliant with medications.  Patient states that she continues to have ongoing issues with urge incontinence.  She has seen urology for this issue in the past.  Patient states that she was seen at [facility] in [facility].  She states that she would like to go back to see them again but does need a referral back. Denies f/c/s, n/v/d, hemoptysis, PND, chest pain or edema. ? ? ? ?No Known Allergies ? ?Immunization History  ?Administered Date(s) Administered  ? Influenza,inj,Quad PF,6+ Mos 07/21/2015, 07/14/2016, 09/21/2018  ? Tdap 09/19/2016  ? ? ?Past Medical History:  ?Diagnosis Date  ? Alopecia   ? Hyperlipidemia   ? Hypertension   ? Seborrhea capitis in adult 07/2019  ? ? ?Tobacco History: ?Social History  ? ?Tobacco Use  ?Smoking Status Never  ?Smokeless Tobacco Never  ? ?Counseling given: Not Answered ? ? ?Outpatient Encounter Medications as of 02/16/2022  ?Medication Sig  ? Multiple Vitamin (MULTIVITAMIN PO) Take by mouth.  ? [DISCONTINUED] DERMA-SMOOTHE/FS SCALP 0.01 % OIL APPLY A SMALL AMOUNT TO AFFECTED AREA ON SCALP EVERY NIGHT  ? [DISCONTINUED] oxybutynin (DITROPAN XL) 10 MG 24 hr tablet Take 1 tablet (10 mg total) by mouth at bedtime.  ? [DISCONTINUED] spironolactone (ALDACTONE) 50  MG tablet Take 50 mg by mouth daily.  ? cholecalciferol (VITAMIN D3) 25 MCG (1000 UNIT) tablet Take 1,000 Units by mouth daily. (Patient not taking: Reported on 02/16/2022)  ? DERMA-SMOOTHE/FS SCALP 0.01 % OIL APPLY A SMALL AMOUNT TO AFFECTED AREA ON SCALP EVERY NIGHT  ? hydrochlorothiazide (HYDRODIURIL) 25 MG tablet Take 1 tablet (25 mg total) by mouth daily.  ? oxybutynin (DITROPAN XL) 10 MG 24 hr tablet Take 1 tablet (10 mg total) by mouth at bedtime.  ? selenium sulfide (SELSUN) 2.5 % shampoo Apply topically daily as needed. (Patient not taking: Reported on 02/16/2022)  ? sildenafil (REVATIO) 20 MG tablet Take 1 tablet (20 mg total) by mouth 3 (three) times daily. (Patient not taking: Reported on 02/16/2022)  ? spironolactone (ALDACTONE) 50 MG tablet Take 1 tablet (50 mg total) by mouth daily.  ? [DISCONTINUED] hydrochlorothiazide (HYDRODIURIL) 25 MG tablet Take 1 tablet (25 mg total) by mouth daily.  ? ?No facility-administered encounter medications on file as of 02/16/2022.  ? ? ? ?Review of Systems ? ?Review of Systems  ?Constitutional: Negative.   ?HENT: Negative.    ?Cardiovascular: Negative.   ?Gastrointestinal: Negative.   ?Genitourinary:  Positive for urgency.  ?Allergic/Immunologic: Negative.   ?Neurological: Negative.   ?Psychiatric/Behavioral: Negative.     ? ? ? ?Physical Exam ? ?BP (!) 128/53 (BP Location: Left Arm, Patient Position: Sitting, Cuff Size: Normal)   Pulse 67   Temp 98.2 ?F (36.8 ?C)   Ht '4\' 11"'$  (1.499 m)  Wt 138 lb 3.2 oz (62.7 kg)   LMP 07/05/2020 (Exact Date)   BMI 27.91 kg/m?  ? ?Wt Readings from Last 5 Encounters:  ?02/16/22 138 lb 3.2 oz (62.7 kg)  ?08/18/21 138 lb 9.6 oz (62.9 kg)  ?05/17/21 137 lb 0.6 oz (62.2 kg)  ?02/08/21 135 lb 0.4 oz (61.2 kg)  ?01/18/21 137 lb 9.6 oz (62.4 kg)  ? ? ? ?Physical Exam ?Vitals and nursing note reviewed.  ?Constitutional:   ?   General: She is not in acute distress. ?   Appearance: She is well-developed.  ?Cardiovascular:  ?   Rate and  Rhythm: Normal rate and regular rhythm.  ?Pulmonary:  ?   Effort: Pulmonary effort is normal.  ?   Breath sounds: Normal breath sounds.  ?Neurological:  ?   Mental Status: She is alert and oriented to person, place, and time.  ? ? ? ?Lab Results: ? ?CBC ?   ?Component Value Date/Time  ? WBC 5.1 12/06/2019 1047  ? WBC 6.5 07/21/2015 1612  ? RBC 4.87 12/06/2019 1047  ? RBC 4.79 07/21/2015 1612  ? HGB 14.8 12/06/2019 1047  ? HCT 43.9 12/06/2019 1047  ? PLT 259 12/06/2019 1047  ? MCV 90 12/06/2019 1047  ? MCH 30.4 12/06/2019 1047  ? MCH 28.4 07/21/2015 1612  ? MCHC 33.7 12/06/2019 1047  ? MCHC 32.9 07/21/2015 1612  ? RDW 12.8 12/06/2019 1047  ? LYMPHSABS 2.2 12/06/2019 1047  ? MONOABS 0.4 07/21/2015 1612  ? EOSABS 0.2 12/06/2019 1047  ? BASOSABS 0.0 12/06/2019 1047  ? ? ?BMET ?   ?Component Value Date/Time  ? NA 141 05/17/2021 1430  ? K 3.8 05/17/2021 1430  ? CL 104 05/17/2021 1430  ? CO2 24 09/21/2018 1001  ? GLUCOSE 89 05/17/2021 1430  ? GLUCOSE 94 12/13/2016 1521  ? BUN 13 05/17/2021 1430  ? CREATININE 0.78 05/17/2021 1430  ? CREATININE 0.79 12/13/2016 1521  ? CALCIUM 10.0 05/17/2021 1430  ? GFRNONAA 85 06/10/2020 0924  ? GFRNONAA 88 12/13/2016 1521  ? GFRAA 98 06/10/2020 0924  ? GFRAA >89 12/13/2016 1521  ? ? ?BNP ?No results found for: BNP ? ?ProBNP ?No results found for: PROBNP ? ?Imaging: ?No results found. ? ? ?Assessment & Plan:  ? ?Urge incontinence ?- oxybutynin (DITROPAN XL) 10 MG 24 hr tablet; Take 1 tablet (10 mg total) by mouth at bedtime.  Dispense: 30 tablet; Refill: 6 ?- Ambulatory referral to Urology ?- CBC ?- Comprehensive metabolic panel ? ?2. Essential hypertension ? ?- spironolactone (ALDACTONE) 50 MG tablet; Take 1 tablet (50 mg total) by mouth daily.  Dispense: 30 tablet; Refill: 2 ?- hydrochlorothiazide (HYDRODIURIL) 25 MG tablet; Take 1 tablet (25 mg total) by mouth daily.  Dispense: 90 tablet; Refill: 3 ?- CBC ?- Comprehensive metabolic panel ? ? ? ?Follow up: ? ?Follow up in 6 months for  physical ? ? ? ? ?Fenton Foy, NP ?02/16/2022 ? ?

## 2022-02-17 LAB — CBC
Hematocrit: 42.3 % (ref 34.0–46.6)
Hemoglobin: 14.4 g/dL (ref 11.1–15.9)
MCH: 29.8 pg (ref 26.6–33.0)
MCHC: 34 g/dL (ref 31.5–35.7)
MCV: 88 fL (ref 79–97)
Platelets: 207 10*3/uL (ref 150–450)
RBC: 4.83 x10E6/uL (ref 3.77–5.28)
RDW: 13.1 % (ref 11.7–15.4)
WBC: 5.8 10*3/uL (ref 3.4–10.8)

## 2022-02-17 LAB — COMPREHENSIVE METABOLIC PANEL
ALT: 40 IU/L — ABNORMAL HIGH (ref 0–32)
AST: 23 IU/L (ref 0–40)
Albumin/Globulin Ratio: 1.5 (ref 1.2–2.2)
Albumin: 4.6 g/dL (ref 3.8–4.9)
Alkaline Phosphatase: 79 IU/L (ref 44–121)
BUN/Creatinine Ratio: 16 (ref 9–23)
BUN: 12 mg/dL (ref 6–24)
Bilirubin Total: 0.3 mg/dL (ref 0.0–1.2)
CO2: 28 mmol/L (ref 20–29)
Calcium: 10.3 mg/dL — ABNORMAL HIGH (ref 8.7–10.2)
Chloride: 102 mmol/L (ref 96–106)
Creatinine, Ser: 0.76 mg/dL (ref 0.57–1.00)
Globulin, Total: 3.1 g/dL (ref 1.5–4.5)
Glucose: 106 mg/dL — ABNORMAL HIGH (ref 70–99)
Potassium: 3.5 mmol/L (ref 3.5–5.2)
Sodium: 144 mmol/L (ref 134–144)
Total Protein: 7.7 g/dL (ref 6.0–8.5)
eGFR: 93 mL/min/{1.73_m2} (ref >59)

## 2022-02-17 LAB — VITAMIN D 25 HYDROXY (VIT D DEFICIENCY, FRACTURES): Vit D, 25-Hydroxy: 32.9 ng/mL (ref 30.0–100.0)

## 2022-06-29 ENCOUNTER — Other Ambulatory Visit: Payer: Self-pay | Admitting: Nurse Practitioner

## 2022-06-29 DIAGNOSIS — I10 Essential (primary) hypertension: Secondary | ICD-10-CM

## 2022-08-17 DIAGNOSIS — N951 Menopausal and female climacteric states: Secondary | ICD-10-CM | POA: Diagnosis not present

## 2022-08-17 DIAGNOSIS — Z114 Encounter for screening for human immunodeficiency virus [HIV]: Secondary | ICD-10-CM | POA: Diagnosis not present

## 2022-08-17 DIAGNOSIS — Z113 Encounter for screening for infections with a predominantly sexual mode of transmission: Secondary | ICD-10-CM | POA: Diagnosis not present

## 2022-08-17 DIAGNOSIS — B3731 Acute candidiasis of vulva and vagina: Secondary | ICD-10-CM | POA: Diagnosis not present

## 2022-08-17 DIAGNOSIS — N760 Acute vaginitis: Secondary | ICD-10-CM | POA: Diagnosis not present

## 2022-08-19 ENCOUNTER — Encounter: Payer: Self-pay | Admitting: Nurse Practitioner

## 2022-08-19 ENCOUNTER — Ambulatory Visit: Payer: Medicaid Other | Admitting: Nurse Practitioner

## 2022-08-19 VITALS — BP 137/64 | HR 70 | Temp 97.7°F | Ht 59.0 in | Wt 138.0 lb

## 2022-08-19 DIAGNOSIS — Z13220 Encounter for screening for lipoid disorders: Secondary | ICD-10-CM | POA: Diagnosis not present

## 2022-08-19 DIAGNOSIS — N3941 Urge incontinence: Secondary | ICD-10-CM

## 2022-08-19 DIAGNOSIS — I10 Essential (primary) hypertension: Secondary | ICD-10-CM

## 2022-08-19 DIAGNOSIS — Z1211 Encounter for screening for malignant neoplasm of colon: Secondary | ICD-10-CM

## 2022-08-19 DIAGNOSIS — H6123 Impacted cerumen, bilateral: Secondary | ICD-10-CM

## 2022-08-19 DIAGNOSIS — Z0000 Encounter for general adult medical examination without abnormal findings: Secondary | ICD-10-CM | POA: Diagnosis not present

## 2022-08-19 DIAGNOSIS — Z1329 Encounter for screening for other suspected endocrine disorder: Secondary | ICD-10-CM | POA: Diagnosis not present

## 2022-08-19 MED ORDER — OXYBUTYNIN CHLORIDE ER 10 MG PO TB24: 10.0000 mg | ORAL_TABLET | Freq: Every day | ORAL | 6 refills | Status: DC

## 2022-08-19 MED ORDER — BIOTIN 10000 MCG PO TABS: 1000.0000 ug | ORAL_TABLET | Freq: Every day | ORAL | 6 refills | Status: DC

## 2022-08-19 NOTE — Patient Instructions (Addendum)
1. Urge incontinence  - oxybutynin (DITROPAN XL) 10 MG 24 hr tablet; Take 1 tablet (10 mg total) by mouth at bedtime.  Dispense: 30 tablet; Refill: 6  2. Essential hypertension  - CBC - Comprehensive metabolic panel - TSH - Lipid Panel  3. Thyroid disorder screening  - TSH  4. Lipid screening  - Lipid Panel  5. Routine adult health maintenance  - CBC - Comprehensive metabolic panel - TSH - Lipid Panel  6. Colon cancer screening  - Ambulatory referral to Gastroenterology  7. Impacted cerumen  Bilateral ear wash in office today  Follow up:  Follow up in 6 months or sooner if needed

## 2022-08-19 NOTE — Assessment & Plan Note (Signed)
-   oxybutynin (DITROPAN XL) 10 MG 24 hr tablet; Take 1 tablet (10 mg total) by mouth at bedtime.  Dispense: 30 tablet; Refill: 6  2. Essential hypertension  - CBC - Comprehensive metabolic panel - TSH - Lipid Panel  3. Thyroid disorder screening  - TSH  4. Lipid screening  - Lipid Panel  5. Routine adult health maintenance  - CBC - Comprehensive metabolic panel - TSH - Lipid Panel  6. Colon cancer screening  - Ambulatory referral to Gastroenterology  Follow up:  Follow up in 6 months or sooner if needed

## 2022-08-19 NOTE — Progress Notes (Signed)
$'@Patient'u$  ID: Lauren Petersen, female    DOB: 1966/02/15, 56 y.o.   MRN: [id number]  Chief Complaint  Patient presents with   Annual Exam    Physical Left ear--painful, --4 days ago.    Referring provider: Vevelyn Francois, NP   HPI  Lauren Petersen presents for follow up. She  has a past medical history of Alopecia, Hyperlipidemia, Hypertension, and Seborrhea capitis in adult (07/2019).    Patient presents today for a physical.  Patient has been having left ear pain for 4 days.  She states that she may have impacted cerumen.  Other than that she has been doing well and has no other issues or concerns. Denies f/c/s, n/v/d, hemoptysis, PND, leg swelling Denies chest pain or edema     No Known Allergies  Immunization History  Administered Date(s) Administered   Influenza,inj,Quad PF,6+ Mos 07/21/2015, 07/14/2016, 09/21/2018   Tdap 09/19/2016    Past Medical History:  Diagnosis Date   Alopecia    Hyperlipidemia    Hypertension    Seborrhea capitis in adult 07/2019    Tobacco History: Social History   Tobacco Use  Smoking Status Never  Smokeless Tobacco Never   Counseling given: Not Answered   Outpatient Encounter Medications as of 08/19/2022  Medication Sig   Biotin 10000 MCG TABS Take 1,000 mcg by mouth daily.   cholecalciferol (VITAMIN D3) 25 MCG (1000 UNIT) tablet Take 1,000 Units by mouth daily.   hydrochlorothiazide (HYDRODIURIL) 25 MG tablet Take 1 tablet (25 mg total) by mouth daily.   Multiple Vitamin (MULTIVITAMIN PO) Take by mouth.   [DISCONTINUED] oxybutynin (DITROPAN XL) 10 MG 24 hr tablet Take 1 tablet (10 mg total) by mouth at bedtime.   DERMA-SMOOTHE/FS SCALP 0.01 % OIL APPLY A SMALL AMOUNT TO AFFECTED AREA ON SCALP EVERY NIGHT   oxybutynin (DITROPAN XL) 10 MG 24 hr tablet Take 1 tablet (10 mg total) by mouth at bedtime.   sildenafil (REVATIO) 20 MG tablet Take 1 tablet (20 mg total) by mouth 3 (three) times daily. (Patient not taking:  Reported on 02/16/2022)   spironolactone (ALDACTONE) 50 MG tablet Take 1 tablet (50 mg total) by mouth daily.   [DISCONTINUED] selenium sulfide (SELSUN) 2.5 % shampoo Apply topically daily as needed. (Patient not taking: Reported on 02/16/2022)   No facility-administered encounter medications on file as of 08/19/2022.     Review of Systems  Review of Systems  Constitutional: Negative.   HENT:  Positive for ear pain.   Cardiovascular: Negative.   Gastrointestinal: Negative.   Allergic/Immunologic: Negative.   Neurological: Negative.   Psychiatric/Behavioral: Negative.         Physical Exam  BP 137/64   Pulse 70   Temp 97.7 F (36.5 C)   Ht '4\' 11"'$  (1.499 m)   Wt 138 lb (62.6 kg)   LMP 07/05/2020 (Exact Date)   SpO2 96%   BMI 27.87 kg/m   Wt Readings from Last 5 Encounters:  08/19/22 138 lb (62.6 kg)  02/16/22 138 lb 3.2 oz (62.7 kg)  08/18/21 138 lb 9.6 oz (62.9 kg)  05/17/21 137 lb 0.6 oz (62.2 kg)  02/08/21 135 lb 0.4 oz (61.2 kg)     Physical Exam Vitals and nursing note reviewed.  Constitutional:      General: She is not in acute distress.    Appearance: She is well-developed.  HENT:     Right Ear: There is impacted cerumen (ear wash performed in office).  Cardiovascular:  Rate and Rhythm: Normal rate and regular rhythm.  Pulmonary:     Effort: Pulmonary effort is normal.     Breath sounds: Normal breath sounds.  Neurological:     Mental Status: She is alert and oriented to person, place, and time.        Assessment & Plan:   Urge incontinence - oxybutynin (DITROPAN XL) 10 MG 24 hr tablet; Take 1 tablet (10 mg total) by mouth at bedtime.  Dispense: 30 tablet; Refill: 6  2. Essential hypertension  - CBC - Comprehensive metabolic panel - TSH - Lipid Panel  3. Thyroid disorder screening  - TSH  4. Lipid screening  - Lipid Panel  5. Routine adult health maintenance  - CBC - Comprehensive metabolic panel - TSH - Lipid Panel  6.  Colon cancer screening  - Ambulatory referral to Gastroenterology  Follow up:  Follow up in 6 months or sooner if needed     Fenton Foy, NP 08/19/2022

## 2022-08-20 LAB — LIPID PANEL
Chol/HDL Ratio: 3.1 ratio (ref 0.0–4.4)
Cholesterol, Total: 156 mg/dL (ref 100–199)
HDL: 50 mg/dL (ref >39)
LDL Chol Calc (NIH): 90 mg/dL (ref 0–99)
Triglycerides: 83 mg/dL (ref 0–149)
VLDL Cholesterol Cal: 16 mg/dL (ref 5–40)

## 2022-08-20 LAB — CBC
Hematocrit: 45 % (ref 34.0–46.6)
Hemoglobin: 14.8 g/dL (ref 11.1–15.9)
MCH: 28.7 pg (ref 26.6–33.0)
MCHC: 32.9 g/dL (ref 31.5–35.7)
MCV: 87 fL (ref 79–97)
Platelets: 220 10*3/uL (ref 150–450)
RBC: 5.15 x10E6/uL (ref 3.77–5.28)
RDW: 12.6 % (ref 11.7–15.4)
WBC: 6.2 10*3/uL (ref 3.4–10.8)

## 2022-08-20 LAB — COMPREHENSIVE METABOLIC PANEL
ALT: 33 IU/L — ABNORMAL HIGH (ref 0–32)
AST: 21 IU/L (ref 0–40)
Albumin/Globulin Ratio: 1.6 (ref 1.2–2.2)
Albumin: 4.7 g/dL (ref 3.8–4.9)
Alkaline Phosphatase: 90 IU/L (ref 44–121)
BUN/Creatinine Ratio: 15 (ref 9–23)
BUN: 13 mg/dL (ref 6–24)
Bilirubin Total: 0.3 mg/dL (ref 0.0–1.2)
CO2: 27 mmol/L (ref 20–29)
Calcium: 10.2 mg/dL (ref 8.7–10.2)
Chloride: 99 mmol/L (ref 96–106)
Creatinine, Ser: 0.85 mg/dL (ref 0.57–1.00)
Globulin, Total: 2.9 g/dL (ref 1.5–4.5)
Glucose: 106 mg/dL — ABNORMAL HIGH (ref 70–99)
Potassium: 3.7 mmol/L (ref 3.5–5.2)
Sodium: 138 mmol/L (ref 134–144)
Total Protein: 7.6 g/dL (ref 6.0–8.5)
eGFR: 81 mL/min/{1.73_m2} (ref >59)

## 2022-08-20 LAB — TSH: TSH: 2.47 u[IU]/mL (ref 0.450–4.500)

## 2022-08-23 ENCOUNTER — Encounter: Payer: Self-pay | Admitting: *Deleted

## 2022-09-28 ENCOUNTER — Ambulatory Visit (AMBULATORY_SURGERY_CENTER): Payer: Self-pay

## 2022-09-28 VITALS — Ht 59.0 in | Wt 137.2 lb

## 2022-09-28 DIAGNOSIS — Z1211 Encounter for screening for malignant neoplasm of colon: Secondary | ICD-10-CM

## 2022-09-28 DIAGNOSIS — H5213 Myopia, bilateral: Secondary | ICD-10-CM | POA: Diagnosis not present

## 2022-09-28 MED ORDER — PLENVU 140 G PO SOLR: 1.0000 | Freq: Once | ORAL | 0 refills | Status: AC

## 2022-09-28 NOTE — Progress Notes (Addendum)
No egg or soy allergy known to patient  No issues known to pt with past sedation with any surgeries or procedures Patient denies ever being told they had issues or difficulty with intubation  No FH of Malignant Hyperthermia Pt is not on diet pills Pt is not on  home 02  Pt is not on blood thinners  Pt denies issues with constipation  No A fib or A flutter Have any cardiac testing pending--no   Interpreter used today at the [facility] for this pt.  Interpreter's name is-  Marcha Dutton

## 2022-10-06 ENCOUNTER — Encounter: Payer: Self-pay | Admitting: Gastroenterology

## 2022-10-06 ENCOUNTER — Ambulatory Visit (AMBULATORY_SURGERY_CENTER): Payer: Medicaid Other | Admitting: Gastroenterology

## 2022-10-06 VITALS — BP 139/70 | HR 65 | Temp 96.4°F | Resp 22 | Ht 59.0 in | Wt 136.2 lb

## 2022-10-06 DIAGNOSIS — Z1211 Encounter for screening for malignant neoplasm of colon: Secondary | ICD-10-CM

## 2022-10-06 DIAGNOSIS — D122 Benign neoplasm of ascending colon: Secondary | ICD-10-CM

## 2022-10-06 DIAGNOSIS — E669 Obesity, unspecified: Secondary | ICD-10-CM | POA: Diagnosis not present

## 2022-10-06 DIAGNOSIS — D123 Benign neoplasm of transverse colon: Secondary | ICD-10-CM

## 2022-10-06 DIAGNOSIS — I10 Essential (primary) hypertension: Secondary | ICD-10-CM | POA: Diagnosis not present

## 2022-10-06 MED ORDER — SODIUM CHLORIDE 0.9 % IV SOLN: 500.0000 mL | Freq: Once | INTRAVENOUS | Status: DC

## 2022-10-06 NOTE — Progress Notes (Signed)
Called to room to assist during endoscopic procedure.  Patient ID and intended procedure confirmed with present staff. Received instructions for my participation in the procedure from the performing physician.  

## 2022-10-06 NOTE — Op Note (Signed)
[facility] Patient Name: Lauren Petersen Procedure Date: 10/06/2022 9:23 AM MRN: [id number] Endoscopist: Mauri Pole , MD, [id number] Age: 57 Referring MD:  Date of Birth: [birth year] Gender: Female Account #: [id number] Procedure:                Colonoscopy Indications:              Screening for colorectal malignant neoplasm Medicines:                Monitored Anesthesia Care Procedure:                Pre-Anesthesia Assessment:                           - Prior to the procedure, a History and Physical                            was performed, and patient medications and                            allergies were reviewed. The patient's tolerance of                            previous anesthesia was also reviewed. The risks                            and benefits of the procedure and the sedation                            options and risks were discussed with the patient.                            All questions were answered, and informed consent                            was obtained. Prior Anticoagulants: The patient has                            taken no anticoagulant or antiplatelet agents. ASA                            Grade Assessment: II - A patient with mild systemic                            disease. After reviewing the risks and benefits,                            the patient was deemed in satisfactory condition to                            undergo the procedure.                           After obtaining informed consent, the colonoscope  was passed under direct vision. Throughout the                            procedure, the patient's blood pressure, pulse, and                            oxygen saturations were monitored continuously. The                            Olympus PCF-H190DL ([id number]) Colonoscope was                            introduced through the anus and advanced to the the                            cecum,  identified by appendiceal orifice and                            ileocecal valve. The colonoscopy was performed                            without difficulty. The patient tolerated the                            procedure well. The quality of the bowel                            preparation was excellent. The ileocecal valve,                            appendiceal orifice, and rectum were photographed. Scope In: 9:33:30 AM Scope Out: 9:53:00 AM Scope Withdrawal Time: 0 hours 10 minutes 52 seconds  Total Procedure Duration: 0 hours 19 minutes 30 seconds  Findings:                 The perianal and digital rectal examinations were                            normal.                           A 1 mm polyp was found in the ascending colon. The                            polyp was sessile. The polyp was removed with a                            cold biopsy forceps. Resection and retrieval were                            complete.                           A 3 mm polyp was found in the transverse colon. The  polyp was sessile. The polyp was removed with a                            cold snare. Resection and retrieval were complete.                           A few small-mouthed diverticula were found in the                            sigmoid colon.                           Non-bleeding external and internal hemorrhoids were                            found during retroflexion. The hemorrhoids were                            medium-sized.                           Anal papilla(e) were hypertrophied. Complications:            No immediate complications. Estimated Blood Loss:     Estimated blood loss was minimal. Impression:               - One 1 mm polyp in the ascending colon, removed                            with a cold biopsy forceps. Resected and retrieved.                           - One 3 mm polyp in the transverse colon, removed                            with a  cold snare. Resected and retrieved.                           - Diverticulosis in the sigmoid colon.                           - Non-bleeding external and internal hemorrhoids.                           - Anal papilla(e) were hypertrophied. Recommendation:           - Patient has a contact number available for                            emergencies. The signs and symptoms of potential                            delayed complications were discussed with the                            patient. Return to normal activities  tomorrow.                            Written discharge instructions were provided to the                            patient.                           - Resume previous diet.                           - Continue present medications.                           - Await pathology results.                           - Repeat colonoscopy in 5-10 years for surveillance                            based on pathology results. Mauri Pole, MD 10/06/2022 10:01:35 AM This report has been signed electronically.

## 2022-10-06 NOTE — Progress Notes (Signed)
Pt's states no medical or surgical changes since previsit or office visit. VS assessed by D.T 

## 2022-10-06 NOTE — Patient Instructions (Signed)
Handouts provided on polyps, diverticulosis and hemorrhoids.   Resume previous diet. Continue present medications.  Await pathology results.  Repeat colonoscopy in 5-10 years for surveillance based on pathology results.   YOU HAD AN ENDOSCOPIC PROCEDURE TODAY AT [facility]:   Refer to the procedure report that was given to you for any specific questions about what was found during the examination.  If the procedure report does not answer your questions, please call your gastroenterologist to clarify.  If you requested that your care partner not be given the details of your procedure findings, then the procedure report has been included in a sealed envelope for you to review at your convenience later.  YOU SHOULD EXPECT: Some feelings of bloating in the abdomen. Passage of more gas than usual.  Walking can help get rid of the air that was put into your GI tract during the procedure and reduce the bloating. If you had a lower endoscopy (such as a colonoscopy or flexible sigmoidoscopy) you may notice spotting of blood in your stool or on the toilet paper. If you underwent a bowel prep for your procedure, you may not have a normal bowel movement for a few days.  Please Note:  You might notice some irritation and congestion in your nose or some drainage.  This is from the oxygen used during your procedure.  There is no need for concern and it should clear up in a day or so.  SYMPTOMS TO REPORT IMMEDIATELY:  Following lower endoscopy (colonoscopy or flexible sigmoidoscopy):  Excessive amounts of blood in the stool  Significant tenderness or worsening of abdominal pains  Swelling of the abdomen that is new, acute  Fever of 100F or higher  For urgent or emergent issues, a gastroenterologist can be reached at any hour by calling [phone]. Do not use MyChart messaging for urgent concerns.    DIET:  We do recommend a small meal at first, but then you may proceed to your regular  diet.  Drink plenty of fluids but you should avoid alcoholic beverages for 24 hours.  ACTIVITY:  You should plan to take it easy for the rest of today and you should NOT DRIVE or use heavy machinery until tomorrow (because of the sedation medicines used during the test).    FOLLOW UP: Our staff will call the number listed on your records the next business day following your procedure.  We will call around 7:15- 8:00 am to check on you and address any questions or concerns that you may have regarding the information given to you following your procedure. If we do not reach you, we will leave a message.     If any biopsies were taken you will be contacted by phone or by letter within the next 1-3 weeks.  Please call us at [phone] if you have not heard about the biopsies in 3 weeks.    SIGNATURES/CONFIDENTIALITY: You and/or your care partner have signed paperwork which will be entered into your electronic medical record.  These signatures attest to the fact that that the information above on your After Visit Summary has been reviewed and is understood.  Full responsibility of the confidentiality of this discharge information lies with you and/or your care-partner.

## 2022-10-06 NOTE — Progress Notes (Signed)
Report to PACU, RN, vss, BBS= Clear.  

## 2022-10-06 NOTE — Progress Notes (Signed)
[facility] History and Physical   Primary Care Physician:  Vevelyn Francois, NP   Reason for Procedure:  Colorectal cancer screening  Plan:    Screening colonoscopy with possible interventions as needed     HPI: Lauren Petersen is a very pleasant 57 y.o. female here for screening colonoscopy. Denies any nausea, vomiting, abdominal pain, melena or bright red blood per rectum  The risks and benefits as well as alternatives of endoscopic procedure(s) have been discussed and reviewed. All questions answered. The patient agrees to proceed.    Past Medical History:  Diagnosis Date   Alopecia    GERD (gastroesophageal reflux disease)    Hypertension    Seborrhea capitis in adult 07/2019    Past Surgical History:  Procedure Laterality Date   CESAREAN SECTION      Prior to Admission medications   Medication Sig Start Date End Date Taking? Authorizing Provider  Biotin 10000 MCG TABS Take 1,000 mcg by mouth daily. Patient not taking: Reported on 09/28/2022 08/19/22   Fenton Foy, NP  cholecalciferol (VITAMIN D3) 25 MCG (1000 UNIT) tablet Take 1,000 Units by mouth daily.    [provider]  DERMA-SMOOTHE/FS SCALP 0.01 % OIL APPLY A SMALL AMOUNT TO AFFECTED AREA ON SCALP EVERY NIGHT Patient not taking: Reported on 09/28/2022 02/16/22   Fenton Foy, NP  hydrochlorothiazide (HYDRODIURIL) 25 MG tablet Take 1 tablet (25 mg total) by mouth daily. 02/16/22 02/16/23  Fenton Foy, NP  Multiple Vitamin (MULTIVITAMIN PO) Take by mouth.    [provider]  oxybutynin (DITROPAN XL) 10 MG 24 hr tablet Take 1 tablet (10 mg total) by mouth at bedtime. 08/19/22   Fenton Foy, NP  sildenafil (REVATIO) 20 MG tablet Take 1 tablet (20 mg total) by mouth 3 (three) times daily. Patient not taking: Reported on 02/16/2022 02/08/21   Vevelyn Francois, NP  spironolactone (ALDACTONE) 50 MG tablet Take 1 tablet (50 mg total) by mouth daily. 02/16/22 05/17/22   Fenton Foy, NP    Current Outpatient Medications  Medication Sig Dispense Refill   Biotin 10000 MCG TABS Take 1,000 mcg by mouth daily. (Patient not taking: Reported on 09/28/2022) 30 tablet 6   cholecalciferol (VITAMIN D3) 25 MCG (1000 UNIT) tablet Take 1,000 Units by mouth daily.     DERMA-SMOOTHE/FS SCALP 0.01 % OIL APPLY A SMALL AMOUNT TO AFFECTED AREA ON SCALP EVERY NIGHT (Patient not taking: Reported on 09/28/2022) 118 mL 0   hydrochlorothiazide (HYDRODIURIL) 25 MG tablet Take 1 tablet (25 mg total) by mouth daily. 90 tablet 3   Multiple Vitamin (MULTIVITAMIN PO) Take by mouth.     oxybutynin (DITROPAN XL) 10 MG 24 hr tablet Take 1 tablet (10 mg total) by mouth at bedtime. 30 tablet 6   sildenafil (REVATIO) 20 MG tablet Take 1 tablet (20 mg total) by mouth 3 (three) times daily. (Patient not taking: Reported on 02/16/2022) 10 tablet 5   spironolactone (ALDACTONE) 50 MG tablet Take 1 tablet (50 mg total) by mouth daily. 30 tablet 2   Current Facility-Administered Medications  Medication Dose Route Frequency Provider Last Rate Last Admin   0.9 %  sodium chloride infusion  500 mL Intravenous Once Mauri Pole, MD        Allergies as of 10/06/2022   (No Known Allergies)    Family History  Problem Relation Age of Onset   Colon cancer Neg Hx    Colon polyps Neg Hx  Esophageal cancer Neg Hx    Rectal cancer Neg Hx    Stomach cancer Neg Hx     Social History   Socioeconomic History   Marital status: Married    Spouse name: Not on file   Number of children: Not on file   Years of education: Not on file   Highest education level: Not on file  Occupational History   Not on file  Tobacco Use   Smoking status: Never   Smokeless tobacco: Never  Vaping Use   Vaping Use: Never used  Substance and Sexual Activity   Alcohol use: No   Drug use: No   Sexual activity: Yes    Comment: pt states shes not having periods  Other Topics Concern   Not on file  Social  History Narrative   Not on file   Social Determinants of Health   Financial Resource Strain: Not on file  Food Insecurity: No Food Insecurity (01/18/2021)   Hunger Vital Sign    Worried About Running Out of Food in the Last Year: Never true    Ran Out of Food in the Last Year: Never true  Transportation Needs: No Transportation Needs (01/18/2021)   PRAPARE - Hydrologist (Medical): No    Lack of Transportation (Non-Medical): No  Physical Activity: Not on file  Stress: Not on file  Social Connections: Not on file  Intimate Partner Violence: Not on file    Review of Systems:  All other review of systems negative except as mentioned in the HPI.  Physical Exam: Vital signs in last 24 hours: Blood Pressure 135/85   Pulse 64   Temperature (Abnormal) 96.4 F (35.8 C) (Skin)   Height '4\' 11"'$  (1.499 m)   Weight 136 lb 3.2 oz (61.8 kg)   Last Menstrual Period 07/05/2020 (Exact Date)   Oxygen Saturation 98%   Body Mass Index 27.51 kg/m  General:   Alert, NAD Lungs:  Clear .   Heart:  Regular rate and rhythm Abdomen:  Soft, nontender and nondistended. Neuro/Psych:  Alert and cooperative. Normal mood and affect. A and O x 3  Reviewed labs, radiology imaging, old records and pertinent past GI work up  Patient is appropriate for planned procedure(s) and anesthesia in an ambulatory setting   K. Denzil Magnuson , MD [phone]

## 2022-10-07 ENCOUNTER — Telehealth: Payer: Self-pay

## 2022-10-07 NOTE — Telephone Encounter (Signed)
  Follow up Call-     10/06/2022    8:58 AM  Call back number  Post procedure Call Back phone  # [phone]  Permission to leave phone message Yes     Patient questions:  Do you have a fever, pain , or abdominal swelling? No. Pain Score  0 *  Have you tolerated food without any problems? Yes.    Have you been able to return to your normal activities? Yes.    Do you have any questions about your discharge instructions: Diet   No. Medications  No. Follow up visit  No.  Do you have questions or concerns about your Care? No.  Actions: * If pain score is 4 or above: No action needed, pain <4.

## 2022-10-13 ENCOUNTER — Encounter: Payer: Self-pay | Admitting: Gastroenterology

## 2022-10-31 ENCOUNTER — Telehealth: Payer: Self-pay | Admitting: Nurse Practitioner

## 2022-10-31 ENCOUNTER — Other Ambulatory Visit: Payer: Self-pay | Admitting: Nurse Practitioner

## 2022-10-31 DIAGNOSIS — N3941 Urge incontinence: Secondary | ICD-10-CM

## 2022-10-31 MED ORDER — OXYBUTYNIN CHLORIDE ER 10 MG PO TB24: 10.0000 mg | ORAL_TABLET | Freq: Every day | ORAL | 1 refills | Status: DC

## 2022-10-31 NOTE — Telephone Encounter (Signed)
Caller & Relationship to patient:  MRN #  [id number]   Call Back Number:   Date of Last Office Visit: 08/19/2022     Date of Next Office Visit: 02/17/2023    Medication(s) to be Refilled: oxycotin 10 mg tablets   Preferred Pharmacy:   ** Please notify patient to allow 48-72 hours to process** **Let patient know to contact pharmacy at the end of the day to make sure medication is ready. ** **If patient has not been seen in a year or longer, book an appointment **Advise to use MyChart for refill requests OR to contact their pharmacy

## 2022-11-02 ENCOUNTER — Other Ambulatory Visit: Payer: Self-pay | Admitting: Nurse Practitioner

## 2022-11-02 DIAGNOSIS — Z1231 Encounter for screening mammogram for malignant neoplasm of breast: Secondary | ICD-10-CM

## 2022-11-04 ENCOUNTER — Other Ambulatory Visit: Payer: Self-pay | Admitting: Nurse Practitioner

## 2022-11-04 DIAGNOSIS — N3941 Urge incontinence: Secondary | ICD-10-CM

## 2022-11-04 MED ORDER — OXYBUTYNIN CHLORIDE ER 10 MG PO TB24: 10.0000 mg | ORAL_TABLET | Freq: Every day | ORAL | 1 refills | Status: DC

## 2022-12-22 ENCOUNTER — Ambulatory Visit
Admission: RE | Admit: 2022-12-22 | Discharge: 2022-12-22 | Disposition: A | Discharge status: Home / Self Care | Payer: Medicaid Other | Source: Ambulatory Visit | Attending: Nurse Practitioner | Admitting: Nurse Practitioner

## 2022-12-22 DIAGNOSIS — Z1231 Encounter for screening mammogram for malignant neoplasm of breast: Secondary | ICD-10-CM

## 2023-02-17 ENCOUNTER — Ambulatory Visit: Payer: Self-pay | Admitting: Nurse Practitioner

## 2023-03-13 ENCOUNTER — Other Ambulatory Visit (HOSPITAL_COMMUNITY)
Admission: RE | Admit: 2023-03-13 | Discharge: 2023-03-13 | Disposition: A | Discharge status: Home / Self Care | Payer: Medicaid Other | Source: Ambulatory Visit | Attending: Nurse Practitioner | Admitting: Nurse Practitioner

## 2023-03-13 ENCOUNTER — Ambulatory Visit: Payer: Medicaid Other | Admitting: Nurse Practitioner

## 2023-03-13 ENCOUNTER — Other Ambulatory Visit: Payer: Self-pay | Admitting: Nurse Practitioner

## 2023-03-13 ENCOUNTER — Encounter: Payer: Self-pay | Admitting: Nurse Practitioner

## 2023-03-13 VITALS — BP 137/46 | HR 70 | Temp 97.2°F | Wt 129.4 lb

## 2023-03-13 DIAGNOSIS — G8929 Other chronic pain: Secondary | ICD-10-CM

## 2023-03-13 DIAGNOSIS — Z124 Encounter for screening for malignant neoplasm of cervix: Secondary | ICD-10-CM | POA: Insufficient documentation

## 2023-03-13 DIAGNOSIS — N393 Stress incontinence (female) (male): Secondary | ICD-10-CM

## 2023-03-13 DIAGNOSIS — I10 Essential (primary) hypertension: Secondary | ICD-10-CM

## 2023-03-13 DIAGNOSIS — M79671 Pain in right foot: Secondary | ICD-10-CM | POA: Diagnosis not present

## 2023-03-13 MED ORDER — HYDROCHLOROTHIAZIDE 25 MG PO TABS: 25.0000 mg | ORAL_TABLET | Freq: Every day | ORAL | 3 refills | Status: DC

## 2023-03-13 NOTE — Patient Instructions (Addendum)
1. Essential hypertension  - hydrochlorothiazide (HYDRODIURIL) 25 MG tablet; Take 1 tablet (25 mg total) by mouth daily.  Dispense: 90 tablet; Refill: 3 - CBC - Basic Metabolic Panel  2. Chronic heel pain, right  - Ambulatory referral to Podiatry  3. Cervical cancer screening  - Cytology - PAP([place])  4. Stress incontinence  - Ambulatory referral to Urology  Follow up:  Follow up in 6 months

## 2023-03-13 NOTE — Progress Notes (Signed)
@Patient  ID: Lauren Petersen, female    DOB: 1966-03-10, 57 y.o.   MRN: [id number]  Chief Complaint  Patient presents with   Hypertension    Referring provider: No ref. provider found   HPI  [facility] presents for follow up. She  has a past medical history of Alopecia, Hyperlipidemia, Hypertension, and Seborrhea capitis in adult (07/2019).    Patient presents today for follow-up on hypertension.  She is currently only taking HCTZ daily.  Patient is due for Pap smear as well today. Denies f/c/s, n/v/d, hemoptysis, PND, leg swelling Denies chest pain or edema  Chronic right heel pain. Had inserts. Needs referral to podiatry.     No Known Allergies  Immunization History  Administered Date(s) Administered   Influenza,inj,Quad PF,6+ Mos 07/21/2015, 07/14/2016, 09/21/2018   Tdap 09/19/2016    Past Medical History:  Diagnosis Date   Alopecia    GERD (gastroesophageal reflux disease)    Hypertension    Seborrhea capitis in adult 07/2019    Tobacco History: Social History   Tobacco Use  Smoking Status Never  Smokeless Tobacco Never   Counseling given: Not Answered   Outpatient Encounter Medications as of 03/13/2023  Medication Sig   cholecalciferol (VITAMIN D3) 25 MCG (1000 UNIT) tablet Take 1,000 Units by mouth daily.   Multiple Vitamin (MULTIVITAMIN PO) Take by mouth.   oxybutynin (DITROPAN XL) 10 MG 24 hr tablet Take 1 tablet (10 mg total) by mouth at bedtime.   [DISCONTINUED] hydrochlorothiazide (HYDRODIURIL) 25 MG tablet Take 1 tablet (25 mg total) by mouth daily.   Biotin 40981 MCG TABS Take 1,000 mcg by mouth daily. (Patient not taking: Reported on 03/13/2023)   DERMA-SMOOTHE/FS SCALP 0.01 % OIL APPLY A SMALL AMOUNT TO AFFECTED AREA ON SCALP EVERY NIGHT (Patient not taking: Reported on 09/28/2022)   hydrochlorothiazide (HYDRODIURIL) 25 MG tablet Take 1 tablet (25 mg total) by mouth daily.   sildenafil (REVATIO) 20 MG tablet Take 1 tablet (20 mg  total) by mouth 3 (three) times daily. (Patient not taking: Reported on 02/16/2022)   spironolactone (ALDACTONE) 50 MG tablet Take 1 tablet (50 mg total) by mouth daily. (Patient not taking: Reported on 03/13/2023)   No facility-administered encounter medications on file as of 03/13/2023.     Review of Systems  Review of Systems  Constitutional: Negative.   HENT: Negative.    Cardiovascular: Negative.   Gastrointestinal: Negative.   Allergic/Immunologic: Negative.   Neurological: Negative.   Psychiatric/Behavioral: Negative.         Physical Exam  BP (!) 137/46   Pulse 70   Temp (!) 97.2 F (36.2 C)   Wt 129 lb 6.4 oz (58.7 kg)   LMP 07/05/2020 (Exact Date)   SpO2 99%   BMI 26.14 kg/m   Wt Readings from Last 5 Encounters:  03/13/23 129 lb 6.4 oz (58.7 kg)  10/06/22 136 lb 3.2 oz (61.8 kg)  09/28/22 137 lb 3.2 oz (62.2 kg)  08/19/22 138 lb (62.6 kg)  02/16/22 138 lb 3.2 oz (62.7 kg)     Physical Exam Vitals and nursing note reviewed. Exam conducted with a chaperone present.  Constitutional:      General: She is not in acute distress.    Appearance: She is well-developed.  Cardiovascular:     Rate and Rhythm: Normal rate and regular rhythm.  Pulmonary:     Effort: Pulmonary effort is normal.     Breath sounds: Normal breath sounds.  Genitourinary:  General: Normal vulva.     Vagina: Normal.     Cervix: Friability present.     Uterus: Normal.   Neurological:     Mental Status: She is alert and oriented to person, place, and time.      Lab Results:  CBC    Component Value Date/Time   WBC 6.2 08/19/2022 1201   WBC 6.5 07/21/2015 1612   RBC 5.15 08/19/2022 1201   RBC 4.79 07/21/2015 1612   HGB 14.8 08/19/2022 1201   HCT 45.0 08/19/2022 1201   PLT 220 08/19/2022 1201   MCV 87 08/19/2022 1201   MCH 28.7 08/19/2022 1201   MCH 28.4 07/21/2015 1612   MCHC 32.9 08/19/2022 1201   MCHC 32.9 07/21/2015 1612   RDW 12.6 08/19/2022 1201   LYMPHSABS 2.2  12/06/2019 1047   MONOABS 0.4 07/21/2015 1612   EOSABS 0.2 12/06/2019 1047   BASOSABS 0.0 12/06/2019 1047    BMET    Component Value Date/Time   NA 138 08/19/2022 1201   K 3.7 08/19/2022 1201   CL 99 08/19/2022 1201   CO2 27 08/19/2022 1201   GLUCOSE 106 (H) 08/19/2022 1201   GLUCOSE 94 12/13/2016 1521   BUN 13 08/19/2022 1201   CREATININE 0.85 08/19/2022 1201   CREATININE 0.79 12/13/2016 1521   CALCIUM 10.2 08/19/2022 1201   GFRNONAA 85 06/10/2020 0924   GFRNONAA 88 12/13/2016 1521   GFRAA 98 06/10/2020 0924   GFRAA >89 12/13/2016 1521     Assessment & Plan:   Essential hypertension - hydrochlorothiazide (HYDRODIURIL) 25 MG tablet; Take 1 tablet (25 mg total) by mouth daily.  Dispense: 90 tablet; Refill: 3 - CBC - Basic Metabolic Panel  2. Chronic heel pain, right  - Ambulatory referral to Podiatry  3. Cervical cancer screening  - Cytology - PAP([place])  4. Stress incontinence  - Ambulatory referral to Urology  Follow up:  Follow up in 6 months     Ivonne Andrew, NP 03/13/2023

## 2023-03-13 NOTE — Assessment & Plan Note (Signed)
-   hydrochlorothiazide (HYDRODIURIL) 25 MG tablet; Take 1 tablet (25 mg total) by mouth daily.  Dispense: 90 tablet; Refill: 3 - CBC - Basic Metabolic Panel  2. Chronic heel pain, right  - Ambulatory referral to Podiatry  3. Cervical cancer screening  - Cytology - PAP([place])  4. Stress incontinence  - Ambulatory referral to Urology  Follow up:  Follow up in 6 months

## 2023-03-14 LAB — CBC
Hematocrit: 45.7 % (ref 34.0–46.6)
Hemoglobin: 15.5 g/dL (ref 11.1–15.9)
MCH: 29.3 pg (ref 26.6–33.0)
MCHC: 33.9 g/dL (ref 31.5–35.7)
MCV: 86 fL (ref 79–97)
Platelets: 241 10*3/uL (ref 150–450)
RBC: 5.29 x10E6/uL — ABNORMAL HIGH (ref 3.77–5.28)
RDW: 12.5 % (ref 11.7–15.4)
WBC: 6.6 10*3/uL (ref 3.4–10.8)

## 2023-03-14 LAB — BASIC METABOLIC PANEL
BUN/Creatinine Ratio: 20 (ref 9–23)
BUN: 16 mg/dL (ref 6–24)
CO2: 29 mmol/L (ref 20–29)
Calcium: 10.4 mg/dL — ABNORMAL HIGH (ref 8.7–10.2)
Chloride: 97 mmol/L (ref 96–106)
Creatinine, Ser: 0.79 mg/dL (ref 0.57–1.00)
Glucose: 103 mg/dL — ABNORMAL HIGH (ref 70–99)
Potassium: 3.1 mmol/L — ABNORMAL LOW (ref 3.5–5.2)
Sodium: 140 mmol/L (ref 134–144)
eGFR: 88 mL/min/{1.73_m2} (ref >59)

## 2023-03-15 LAB — CYTOLOGY - PAP: Diagnosis: NEGATIVE

## 2023-05-08 ENCOUNTER — Encounter: Payer: Self-pay | Admitting: Podiatry

## 2023-05-08 ENCOUNTER — Ambulatory Visit (INDEPENDENT_AMBULATORY_CARE_PROVIDER_SITE_OTHER): Payer: Medicaid Other

## 2023-05-08 ENCOUNTER — Ambulatory Visit (INDEPENDENT_AMBULATORY_CARE_PROVIDER_SITE_OTHER): Payer: Medicaid Other | Admitting: Podiatry

## 2023-05-08 DIAGNOSIS — M722 Plantar fascial fibromatosis: Secondary | ICD-10-CM | POA: Diagnosis not present

## 2023-05-08 MED ORDER — TRIAMCINOLONE ACETONIDE 10 MG/ML IJ SUSP
10.0000 mg | Freq: Once | INTRAMUSCULAR | Status: AC
Start: 2023-05-08 — End: 2023-05-08
  Administered 2023-05-08: 10 mg via INTRA_ARTICULAR

## 2023-05-08 NOTE — Patient Instructions (Signed)

## 2023-05-08 NOTE — Progress Notes (Signed)
Subjective:   Patient ID: Lauren Petersen, female   DOB: 57 y.o.   MRN: [id number]   HPI Patient presents with translator stating that she is having a lot of pain in her right heel around 7 months duration no history of treatment except for shoe gear modifications and ice.  Patient does not smoke likes to be active   Review of Systems  All other systems reviewed and are negative.       Objective:  Physical Exam Vitals and nursing note reviewed.  Constitutional:      Appearance: She is well-developed.  Pulmonary:     Effort: Pulmonary effort is normal.  Musculoskeletal:        General: Normal range of motion.  Skin:    General: Skin is warm.  Neurological:     Mental Status: She is alert.     Neurovascular status intact muscle strength was found to be adequate range of motion are adequate exquisite discomfort medial fascial band right at the insertion of the tendon calcaneus inflammation fluid around the band on its insertion.  Good digital perfusion well-oriented     Assessment:  Acute Planter fasciitis right inflammation fluid in the medial band     Plan:  8 H&P reviewed condition sterile prep injected the fascia at insertion 3 mg Kenalog 5 mg Xylocaine applied fascial taping to hold up the arch and prevent stress and pull reappoint to recheck all questions answered begin physical therapy  X-rays indicate minimal spur formation no indication stress fracture arthritis

## 2023-09-13 ENCOUNTER — Ambulatory Visit: Payer: Medicaid Other | Admitting: Nurse Practitioner

## 2023-09-13 ENCOUNTER — Encounter: Payer: Self-pay | Admitting: Nurse Practitioner

## 2023-09-13 VITALS — BP 137/68 | HR 68 | Temp 97.0°F | Wt 136.0 lb

## 2023-09-13 DIAGNOSIS — G8929 Other chronic pain: Secondary | ICD-10-CM

## 2023-09-13 DIAGNOSIS — Z23 Encounter for immunization: Secondary | ICD-10-CM | POA: Diagnosis not present

## 2023-09-13 DIAGNOSIS — Z13220 Encounter for screening for lipoid disorders: Secondary | ICD-10-CM | POA: Diagnosis not present

## 2023-09-13 DIAGNOSIS — E559 Vitamin D deficiency, unspecified: Secondary | ICD-10-CM

## 2023-09-13 DIAGNOSIS — R519 Headache, unspecified: Secondary | ICD-10-CM | POA: Diagnosis not present

## 2023-09-13 DIAGNOSIS — I10 Essential (primary) hypertension: Secondary | ICD-10-CM | POA: Diagnosis not present

## 2023-09-13 DIAGNOSIS — Z9181 History of falling: Secondary | ICD-10-CM

## 2023-09-13 MED ORDER — TIZANIDINE HCL 4 MG PO TABS: 4.0000 mg | ORAL_TABLET | Freq: Four times a day (QID) | ORAL | 0 refills | Status: DC | PRN

## 2023-09-13 NOTE — Patient Instructions (Addendum)
1. Need for influenza vaccination  - Flu vaccine trivalent PF, 6mos and older(Flulaval,Afluria,Fluarix,Fluzone)  2. Lipid screening  - Lipid Panel  3. Essential hypertension  - CBC - Comprehensive metabolic panel  4. Vitamin D deficiency  - Vitamin D, 25-hydroxy  5. Personal history of fall  - Ambulatory referral to Neurology  6. Chronic nonintractable headache, unspecified headache type  - Ambulatory referral to Neurology - tiZANidine (ZANAFLEX) 4 MG tablet; Take 1 tablet (4 mg total) by mouth every 6 (six) hours as needed for muscle spasms.  Dispense: 30 tablet; Refill: 0  Follow up:  Follow up in 6 months

## 2023-09-13 NOTE — Progress Notes (Signed)
Subjective   Patient ID: Lauren Petersen, female    DOB: July 02, 1966, 57 y.o.   MRN: [id number]  Chief Complaint  Patient presents with   Follow-up   Hypertension    Referring provider: Ivonne Andrew, NP  Lauren Petersen is a 57 y.o. female with Past Medical History: No date: Alopecia No date: GERD (gastroesophageal reflux disease) No date: Hypertension 07/2019: Seborrhea capitis in adult   HPI  Patient presents today to follow-up on hypertension.  Vital signs are stable in office today.  Patient states that she is compliant with medications.  Patient is following a low-salt diet but is not exercising. Denies f/c/s, n/v/d, hemoptysis, PND, leg swelling. Denies chest pain or edema.   Patient is complaining today of headaches.  She states that this has been going on for several months and is worsening.  She also complains of pain to her left side.  She states that she did have a couple falls and did hit her head but this has been a few years ago.  We will place a referral to neurology for further evaluation and trial muscle relaxer.     No Known Allergies  Immunization History  Administered Date(s) Administered   Influenza,inj,Quad PF,6+ Mos 07/21/2015, 07/14/2016, 09/21/2018   Tdap 09/19/2016    Tobacco History: Social History   Tobacco Use  Smoking Status Never  Smokeless Tobacco Never   Counseling given: Not Answered   Outpatient Encounter Medications as of 09/13/2023  Medication Sig   cholecalciferol (VITAMIN D3) 25 MCG (1000 UNIT) tablet Take 1,000 Units by mouth daily.   hydrochlorothiazide (HYDRODIURIL) 25 MG tablet Take 1 tablet (25 mg total) by mouth daily.   Multiple Vitamin (MULTIVITAMIN PO) Take by mouth.   tiZANidine (ZANAFLEX) 4 MG tablet Take 1 tablet (4 mg total) by mouth every 6 (six) hours as needed for muscle spasms.   oxybutynin (DITROPAN XL) 10 MG 24 hr tablet Take 1 tablet (10 mg total) by mouth at bedtime. (Patient not taking:  Reported on 09/13/2023)   No facility-administered encounter medications on file as of 09/13/2023.    Review of Systems  Review of Systems  Constitutional: Negative.   HENT: Negative.    Cardiovascular: Negative.   Gastrointestinal: Negative.   Allergic/Immunologic: Negative.   Neurological:  Positive for headaches.  Psychiatric/Behavioral: Negative.       Objective:   BP 137/68   Pulse 68   Temp (!) 97 F (36.1 C)   Wt 136 lb (61.7 kg)   LMP 07/05/2020 (Exact Date)   SpO2 100%   BMI 27.47 kg/m   Wt Readings from Last 5 Encounters:  09/13/23 136 lb (61.7 kg)  03/13/23 129 lb 6.4 oz (58.7 kg)  10/06/22 136 lb 3.2 oz (61.8 kg)  09/28/22 137 lb 3.2 oz (62.2 kg)  08/19/22 138 lb (62.6 kg)     Physical Exam Vitals and nursing note reviewed.  Constitutional:      General: She is not in acute distress.    Appearance: She is well-developed.  Cardiovascular:     Rate and Rhythm: Normal rate and regular rhythm.  Pulmonary:     Effort: Pulmonary effort is normal.     Breath sounds: Normal breath sounds.  Neurological:     Mental Status: She is alert and oriented to person, place, and time.       Assessment & Plan:   Need for influenza vaccination -     Flu vaccine trivalent PF, 6mos and older(Flulaval,Afluria,Fluarix,Fluzone)  Lipid screening -     Lipid panel  Essential hypertension -     CBC -     Comprehensive metabolic panel  Vitamin D deficiency -     VITAMIN D 25 Hydroxy (Vit-D Deficiency, Fractures)  Personal history of fall -     Ambulatory referral to Neurology  Chronic nonintractable headache, unspecified headache type -     Ambulatory referral to Neurology -     tiZANidine HCl; Take 1 tablet (4 mg total) by mouth every 6 (six) hours as needed for muscle spasms.  Dispense: 30 tablet; Refill: 0     Return in about 6 months (around 03/13/2024).   Ivonne Andrew, NP 09/13/2023

## 2023-09-14 LAB — CBC
Hematocrit: 46.2 % (ref 34.0–46.6)
Hemoglobin: 15.2 g/dL (ref 11.1–15.9)
MCH: 29.4 pg (ref 26.6–33.0)
MCHC: 32.9 g/dL (ref 31.5–35.7)
MCV: 89 fL (ref 79–97)
Platelets: 223 10*3/uL (ref 150–450)
RBC: 5.17 x10E6/uL (ref 3.77–5.28)
RDW: 13.4 % (ref 11.7–15.4)
WBC: 6.9 10*3/uL (ref 3.4–10.8)

## 2023-09-14 LAB — COMPREHENSIVE METABOLIC PANEL
ALT: 25 [IU]/L (ref 0–32)
AST: 20 [IU]/L (ref 0–40)
Albumin: 4.6 g/dL (ref 3.8–4.9)
Alkaline Phosphatase: 95 [IU]/L (ref 44–121)
BUN/Creatinine Ratio: 18 (ref 9–23)
BUN: 16 mg/dL (ref 6–24)
Bilirubin Total: 0.3 mg/dL (ref 0.0–1.2)
CO2: 26 mmol/L (ref 20–29)
Calcium: 10.6 mg/dL — ABNORMAL HIGH (ref 8.7–10.2)
Chloride: 97 mmol/L (ref 96–106)
Creatinine, Ser: 0.89 mg/dL (ref 0.57–1.00)
Globulin, Total: 3.4 g/dL (ref 1.5–4.5)
Glucose: 104 mg/dL — ABNORMAL HIGH (ref 70–99)
Potassium: 3.6 mmol/L (ref 3.5–5.2)
Sodium: 140 mmol/L (ref 134–144)
Total Protein: 8 g/dL (ref 6.0–8.5)
eGFR: 77 mL/min/{1.73_m2} (ref >59)

## 2023-09-14 LAB — LIPID PANEL
Chol/HDL Ratio: 3.1 {ratio} (ref 0.0–4.4)
Cholesterol, Total: 177 mg/dL (ref 100–199)
HDL: 57 mg/dL (ref >39)
LDL Chol Calc (NIH): 105 mg/dL — ABNORMAL HIGH (ref 0–99)
Triglycerides: 80 mg/dL (ref 0–149)
VLDL Cholesterol Cal: 15 mg/dL (ref 5–40)

## 2023-09-14 LAB — VITAMIN D 25 HYDROXY (VIT D DEFICIENCY, FRACTURES): Vit D, 25-Hydroxy: 47.9 ng/mL (ref 30.0–100.0)

## 2023-10-17 DIAGNOSIS — N3946 Mixed incontinence: Secondary | ICD-10-CM | POA: Diagnosis not present

## 2023-10-17 DIAGNOSIS — R351 Nocturia: Secondary | ICD-10-CM | POA: Diagnosis not present

## 2023-10-17 DIAGNOSIS — R350 Frequency of micturition: Secondary | ICD-10-CM | POA: Diagnosis not present

## 2023-11-14 ENCOUNTER — Encounter: Payer: Self-pay | Admitting: Nurse Practitioner

## 2023-11-14 ENCOUNTER — Ambulatory Visit (INDEPENDENT_AMBULATORY_CARE_PROVIDER_SITE_OTHER): Payer: Self-pay | Admitting: Nurse Practitioner

## 2023-11-14 ENCOUNTER — Other Ambulatory Visit: Payer: Self-pay | Admitting: Nurse Practitioner

## 2023-11-14 VITALS — BP 138/64 | HR 75 | Temp 97.2°F | Wt 136.0 lb

## 2023-11-14 DIAGNOSIS — J029 Acute pharyngitis, unspecified: Secondary | ICD-10-CM

## 2023-11-14 DIAGNOSIS — Z1231 Encounter for screening mammogram for malignant neoplasm of breast: Secondary | ICD-10-CM

## 2023-11-14 DIAGNOSIS — H6121 Impacted cerumen, right ear: Secondary | ICD-10-CM

## 2023-11-14 NOTE — Patient Instructions (Signed)

## 2023-11-14 NOTE — Progress Notes (Signed)
Acute Office Visit  Subjective:     Patient ID: Lauren Petersen, female    DOB: 01/19/1966, 58 y.o.   MRN: [id number]  Chief Complaint  Patient presents with   Sore Throat    HPI Lauren Petersen has a past medical history of Alopecia, GERD (gastroesophageal reflux disease), Hypertension, and Seborrhea capitis in adult (07/2019).  Patient is in today for complaints of sore throat that started about 7 days ago, states that she initially had fever and bilateral ear pain that have since resolved, has a little headache currently.  She has been using OTC sore throat relief, warm water with honey ginger and lemon, all these been helpful in her sore throat feels much better today.  Patient currently denies fever, chills, trouble hearing, ear drainage cough, shortness of breath abdominal pain nausea vomiting.    Review of Systems  Constitutional:  Negative for appetite change, chills, fatigue and fever.  HENT:  Positive for sore throat. Negative for congestion, postnasal drip, rhinorrhea and sneezing.   Respiratory:  Negative for cough, shortness of breath and wheezing.   Cardiovascular:  Negative for chest pain, palpitations and leg swelling.  Gastrointestinal:  Negative for abdominal pain, constipation, nausea and vomiting.  Genitourinary:  Negative for difficulty urinating, dysuria, flank pain and frequency.  Musculoskeletal:  Negative for arthralgias, back pain, joint swelling and myalgias.  Skin:  Negative for color change, pallor, rash and wound.  Neurological:  Positive for headaches. Negative for dizziness, facial asymmetry, weakness and numbness.  Psychiatric/Behavioral:  Negative for behavioral problems, confusion, self-injury and suicidal ideas.         Objective:    BP 138/64   Pulse 75   Temp (!) 97.2 F (36.2 C)   Wt 136 lb (61.7 kg)   LMP 07/05/2020 (Exact Date)   SpO2 99%   BMI 27.47 kg/m    Physical Exam Vitals reviewed.  Constitutional:      General: She is  not in acute distress.    Appearance: Normal appearance. She is not ill-appearing, toxic-appearing or diaphoretic.  HENT:     Right Ear: Tympanic membrane, ear canal and external ear normal. No drainage, swelling or tenderness. No middle ear effusion. There is no impacted cerumen. Tympanic membrane is not erythematous.     Left Ear: Tympanic membrane, ear canal and external ear normal. No drainage, swelling or tenderness.  No middle ear effusion. There is no impacted cerumen. Tympanic membrane is not erythematous.     Nose: No congestion or rhinorrhea.     Mouth/Throat:     Mouth: Mucous membranes are moist. No oral lesions.     Pharynx: Oropharynx is clear. No pharyngeal swelling, oropharyngeal exudate, posterior oropharyngeal erythema or uvula swelling.     Tonsils: No tonsillar exudate or tonsillar abscesses. 0 on the right. 0 on the left.  Eyes:     General: No scleral icterus.       Right eye: No discharge.        Left eye: No discharge.     Extraocular Movements: Extraocular movements intact.     Conjunctiva/sclera: Conjunctivae normal.  Cardiovascular:     Rate and Rhythm: Normal rate and regular rhythm.     Pulses: Normal pulses.     Heart sounds: Normal heart sounds. No murmur heard. Pulmonary:     Effort: Pulmonary effort is normal. No respiratory distress.     Breath sounds: Normal breath sounds. No stridor. No wheezing, rhonchi or rales.  Chest:  Chest wall: No tenderness.  Abdominal:     General: There is no distension.     Palpations: Abdomen is soft.     Tenderness: There is no abdominal tenderness. There is no guarding.  Musculoskeletal:        General: No tenderness.     Cervical back: Normal range of motion and neck supple. No rigidity or tenderness.     Right lower leg: No edema.     Left lower leg: No edema.  Lymphadenopathy:     Cervical: No cervical adenopathy.  Skin:    General: Skin is warm and dry.     Capillary Refill: Capillary refill takes less  than 2 seconds.  Neurological:     Mental Status: She is alert and oriented to person, place, and time.     Motor: No weakness.     Coordination: Coordination normal.     Gait: Gait normal.  Psychiatric:        Mood and Affect: Mood normal.        Behavior: Behavior normal.        Thought Content: Thought content normal.        Judgment: Judgment normal.     No results found for any visits on 11/14/23.      Assessment & Plan:   Problem List Items Addressed This Visit       Respiratory   Acute pharyngitis - Primary   Feels much better today Negative for strep  We will continue symptomatic treatment with OTC sore throat relief, warm water, salt water gargle Take Tylenol as needed for headache         Nervous and Auditory   Impacted cerumen, right ear   Excess wax noted in the left ear Verbal consent obtained Left ear irrigated with combination of water and Dulcolax Wax successfully removed Pt tolerated well No immediate complication noted , examination of left ear was normal  after procedure       No orders of the defined types were placed in this encounter.   No follow-ups on file.  Donell Beers, FNP

## 2023-11-14 NOTE — Assessment & Plan Note (Addendum)
Excess wax noted in the left ear Verbal consent obtained Left ear irrigated with combination of water and Dulcolax Wax successfully removed Pt tolerated well No immediate complication noted , examination of left ear was normal  after procedure

## 2023-11-14 NOTE — Assessment & Plan Note (Signed)
Feels much better today Negative for strep  We will continue symptomatic treatment with OTC sore throat relief, warm water, salt water gargle Take Tylenol as needed for headache

## 2023-11-15 LAB — POCT RAPID STREP A (OFFICE): Rapid Strep A Screen: NEGATIVE

## 2023-11-15 NOTE — Addendum Note (Signed)
Addended by: Renelda Loma on: 11/15/2023 07:49 AM   Modules accepted: Orders

## 2023-11-24 DIAGNOSIS — N3946 Mixed incontinence: Secondary | ICD-10-CM | POA: Diagnosis not present

## 2023-11-24 DIAGNOSIS — R350 Frequency of micturition: Secondary | ICD-10-CM | POA: Diagnosis not present

## 2023-12-25 ENCOUNTER — Ambulatory Visit
Admission: RE | Admit: 2023-12-25 | Discharge: 2023-12-25 | Disposition: A | Discharge status: Home / Self Care | Payer: Medicaid Other | Source: Ambulatory Visit | Attending: Nurse Practitioner

## 2023-12-25 DIAGNOSIS — Z1231 Encounter for screening mammogram for malignant neoplasm of breast: Secondary | ICD-10-CM

## 2023-12-26 ENCOUNTER — Encounter: Payer: Self-pay | Admitting: Diagnostic Neuroimaging

## 2023-12-26 ENCOUNTER — Ambulatory Visit: Payer: Medicaid Other | Admitting: Diagnostic Neuroimaging

## 2023-12-26 VITALS — BP 138/62 | HR 78 | Ht 59.0 in | Wt 137.0 lb

## 2023-12-26 DIAGNOSIS — F419 Anxiety disorder, unspecified: Secondary | ICD-10-CM

## 2023-12-26 DIAGNOSIS — R51 Headache with orthostatic component, not elsewhere classified: Secondary | ICD-10-CM

## 2023-12-26 MED ORDER — AMITRIPTYLINE HCL 25 MG PO TABS: 25.0000 mg | ORAL_TABLET | Freq: Every day | ORAL | 3 refills | Status: AC

## 2023-12-26 NOTE — Progress Notes (Signed)
 [facility]  PATIENT: Lauren Petersen DOB: 06/12/1966  REFERRING CLINICIAN: Ivonne Andrew, NP HISTORY FROM: patient  REASON FOR VISIT: new consult   HISTORICAL  CHIEF COMPLAINT:  Chief Complaint  Patient presents with   New Patient (Initial Visit)    Patient in room #6 with interpreter. Patient states when she wakes up in the morning she been having headaches. Patient states if lay down for a long period time she can feel a headaches coming on. Patient states she doesn't have an headache today.    HISTORY OF PRESENT ILLNESS:   58 year old female here for evaluation of headaches.  Patient has had headaches for at least for 5 years with global pain, sensitivity to sound, worse with laying down and better with getting up.  She does report some falls around 10 to 12 years ago.  [place] in [place]  She has some ongoing issues with anxiety think about her family in the [place] as well as her family here.  She averages 4 to 5 hours of sleep per night.  Triggers include increased stress and anger issues.  Has been using over-the-counter medications for headache management.   REVIEW OF SYSTEMS: Full 14 system review of systems performed and negative with exception of: as per HPI.  ALLERGIES: No Known Allergies  HOME MEDICATIONS: Outpatient Medications Prior to Visit  Medication Sig Dispense Refill   cholecalciferol (VITAMIN D3) 25 MCG (1000 UNIT) tablet Take 1,000 Units by mouth daily.     cromolyn (OPTICROM) 4 % ophthalmic solution 2 drops 4 (four) times daily.     hydrochlorothiazide (HYDRODIURIL) 25 MG tablet Take 1 tablet (25 mg total) by mouth daily. 90 tablet 3   Multiple Vitamin (MULTIVITAMIN PO) Take by mouth.     Vibegron (GEMTESA) 75 MG TABS Take 75 mg by mouth daily.     XIIDRA 5 % SOLN Apply 1 drop to eye 2 (two) times daily.     oxybutynin (DITROPAN XL) 10 MG 24 hr tablet Take 1 tablet (10 mg total) by  mouth at bedtime. (Patient not taking: Reported on 09/13/2023) 90 tablet 1   tiZANidine (ZANAFLEX) 4 MG tablet Take 1 tablet (4 mg total) by mouth every 6 (six) hours as needed for muscle spasms. (Patient not taking: Reported on 12/26/2023) 30 tablet 0   No facility-administered medications prior to visit.    PAST MEDICAL HISTORY: Past Medical History:  Diagnosis Date   Alopecia    GERD (gastroesophageal reflux disease)    Hypertension    Seborrhea capitis in adult 07/2019    PAST SURGICAL HISTORY: Past Surgical History:  Procedure Laterality Date   CESAREAN SECTION      FAMILY HISTORY: Family History  Problem Relation Age of Onset   Colon cancer Neg Hx    Colon polyps Neg Hx    Esophageal cancer Neg Hx    Rectal cancer Neg Hx    Stomach cancer Neg Hx     SOCIAL HISTORY: Social History   Socioeconomic History   Marital status: Married    Spouse name: Not on file   Number of children: Not on file   Years of education: Not on file   Highest education level: Not on file  Occupational History   Not on file  Tobacco Use   Smoking status: Never   Smokeless tobacco: Never  Vaping Use   Vaping status: Never Used  Substance and Sexual Activity  Alcohol use: No   Drug use: No   Sexual activity: Yes    Comment: pt states shes not having periods  Other Topics Concern   Not on file  Social History Narrative   Not on file   Social Drivers of Health   Financial Resource Strain: Not on file  Food Insecurity: No Food Insecurity (01/18/2021)   Hunger Vital Sign    Worried About Running Out of Food in the Last Year: Never true    Ran Out of Food in the Last Year: Never true  Transportation Needs: No Transportation Needs (01/18/2021)   PRAPARE - Administrator, Civil Service (Medical): No    Lack of Transportation (Non-Medical): No  Physical Activity: Not on file  Stress: Not on file  Social Connections: Unknown (02/15/2022)   Received from [facility],  [facility]   Social Network    Social Network: Not on file  Intimate Partner Violence: Unknown (01/07/2022)   Received from [facility], [facility]   HITS    Physically Hurt: Not on file    Insult or Talk Down To: Not on file    Threaten Physical Harm: Not on file    Scream or Curse: Not on file     PHYSICAL EXAM  GENERAL EXAM/CONSTITUTIONAL: Vitals:  Vitals:   12/26/23 0911  BP: 138/62  Pulse: 78  Weight: 137 lb (62.1 kg)  Height: 4\' 11"  (1.499 m)   Body mass index is 27.67 kg/m. Wt Readings from Last 3 Encounters:  12/26/23 137 lb (62.1 kg)  11/14/23 136 lb (61.7 kg)  09/13/23 136 lb (61.7 kg)   Patient is in no distress; well developed, nourished and groomed; neck is supple  CARDIOVASCULAR: Examination of carotid arteries is normal; no carotid bruits Regular rate and rhythm, no murmurs Examination of peripheral vascular system by observation and palpation is normal  EYES: Ophthalmoscopic exam of optic discs and posterior segments is normal; no papilledema or hemorrhages No results found.  MUSCULOSKELETAL: Gait, strength, tone, movements noted in Neurologic exam below  NEUROLOGIC: MENTAL STATUS:      No data to display         awake, alert, oriented to person, place and time recent and remote memory intact normal attention and concentration language fluent, comprehension intact, naming intact fund of knowledge appropriate  CRANIAL NERVE:  2nd - no papilledema on fundoscopic exam 2nd, 3rd, 4th, 6th - pupils equal and reactive to light, visual fields full to confrontation, extraocular muscles intact, no nystagmus 5th - facial sensation symmetric 7th - facial strength symmetric 8th - hearing intact 9th - palate elevates symmetrically, uvula midline 11th - shoulder shrug symmetric 12th - tongue protrusion midline  MOTOR:  normal bulk and tone, full strength in the BUE, BLE  SENSORY:  normal and symmetric to light touch, temperature,  vibration  COORDINATION:  finger-nose-finger, fine finger movements normal  REFLEXES:  deep tendon reflexes trace and symmetric  GAIT/STATION:  narrow based gait     DIAGNOSTIC DATA (LABS, IMAGING, TESTING) - I reviewed patient records, labs, notes, testing and imaging myself where available.  Lab Results  Component Value Date   WBC 6.9 09/13/2023   HGB 15.2 09/13/2023   HCT 46.2 09/13/2023   MCV 89 09/13/2023   PLT 223 09/13/2023      Component Value Date/Time   NA 140 09/13/2023 0931   K 3.6 09/13/2023 0931   CL 97 09/13/2023 0931   CO2 26 09/13/2023 0931  GLUCOSE 104 (H) 09/13/2023 0931   GLUCOSE 94 12/13/2016 1521   BUN 16 09/13/2023 0931   CREATININE 0.89 09/13/2023 0931   CREATININE 0.79 12/13/2016 1521   CALCIUM 10.6 (H) 09/13/2023 0931   PROT 8.0 09/13/2023 0931   ALBUMIN 4.6 09/13/2023 0931   AST 20 09/13/2023 0931   ALT 25 09/13/2023 0931   ALKPHOS 95 09/13/2023 0931   BILITOT 0.3 09/13/2023 0931   GFRNONAA 85 06/10/2020 0924   GFRNONAA 88 12/13/2016 1521   GFRAA 98 06/10/2020 0924   GFRAA >89 12/13/2016 1521   Lab Results  Component Value Date   CHOL 177 09/13/2023   HDL 57 09/13/2023   LDLCALC 105 (H) 09/13/2023   TRIG 80 09/13/2023   CHOLHDL 3.1 09/13/2023   Lab Results  Component Value Date   HGBA1C 5.4 08/18/2021   HGBA1C 5.4 08/18/2021   HGBA1C 5.4 (A) 08/18/2021   HGBA1C 5.4 08/18/2021   Lab Results  Component Value Date   VITAMINB12 723 12/06/2019   Lab Results  Component Value Date   TSH 2.470 08/19/2022     ASSESSMENT AND PLAN  58 y.o. year old female here with:  Dx:  1. Positional headache     PLAN:  POSITIONAL HEADACHE (worse with laying down; better with standing up and moving; since ~ 2020; also only ~4-5 hours sleep per night; also some anxiety symptoms)  - check MRI brain  - optimize sleep, stress mgmt, nutrition, exercise  - amitriptyline 25mg  at bedtime (to help with mild anxiety, insomnia and  headache prevention)  - ibuprofen, tylenol as needed  - To prevent or relieve headaches, try the following: Cool Compress. Lie down and place a cool compress on your head.   Avoid headache triggers. If certain foods or odors seem to have triggered your migraines in the past, avoid them. A headache diary might help you identify triggers.   Include physical activity in your daily routine.  Manage stress. Find healthy ways to cope with the stressors, such as delegating tasks on your to-do list.   Practice relaxation techniques. Try deep breathing, yoga, massage and visualization.   Eat regularly. Eating regularly scheduled meals and maintaining a healthy diet might help prevent headaches. Also, drink plenty of fluids.   Follow a regular sleep schedule. Sleep deprivation might contribute to headaches Consider biofeedback. With this mind-body technique, you learn to control certain bodily functions -- such as muscle tension, heart rate and blood pressure -- to prevent headaches or reduce headache pain.  Orders Placed This Encounter  Procedures   MR BRAIN W WO CONTRAST   Return in about 6 months (around 06/27/2024).    Suanne Marker, MD 12/26/2023, 9:52 AM Certified in Neurology, Neurophysiology and Neuroimaging  [facility] [address] [place], [place] [address] [phone]

## 2023-12-26 NOTE — Patient Instructions (Addendum)
  POSITIONAL HEADACHE (worse with laying down; better with standing up and moving; since ~ 2020; also only ~4-5 hours sleep per night; also some anxiety symptoms)  - check MRI brain  - optimize sleep, stress mgmt, nutrition, exercise  - amitriptyline 25mg  at bedtime  - ibuprofen, tylenol as needed  - To prevent or relieve headaches, try the following: Cool Compress. Lie down and place a cool compress on your head.   Avoid headache triggers. If certain foods or odors seem to have triggered your migraines in the past, avoid them. A headache diary might help you identify triggers.   Include physical activity in your daily routine.  Manage stress. Find healthy ways to cope with the stressors, such as delegating tasks on your to-do list.   Practice relaxation techniques. Try deep breathing, yoga, massage and visualization.   Eat regularly. Eating regularly scheduled meals and maintaining a healthy diet might help prevent headaches. Also, drink plenty of fluids.   Follow a regular sleep schedule. Sleep deprivation might contribute to headaches Consider biofeedback. With this mind-body technique, you learn to control certain bodily functions -- such as muscle tension, heart rate and blood pressure -- to prevent headaches or reduce headache pain.

## 2023-12-28 ENCOUNTER — Telehealth: Payer: Self-pay | Admitting: Diagnostic Neuroimaging

## 2023-12-28 NOTE — Telephone Encounter (Signed)
 Referral for psychology fax to [facility]. Phone: [phone], Fax: [phone]

## 2024-01-03 ENCOUNTER — Telehealth: Payer: Self-pay | Admitting: Diagnostic Neuroimaging

## 2024-01-03 NOTE — Telephone Encounter (Signed)
 Referral for psychology fax to [facility]. Phone: [phone], Fax: [phone]

## 2024-02-05 ENCOUNTER — Ambulatory Visit
Admission: RE | Admit: 2024-02-05 | Discharge: 2024-02-05 | Disposition: A | Discharge status: Home / Self Care | Source: Ambulatory Visit | Attending: Diagnostic Neuroimaging | Admitting: Diagnostic Neuroimaging

## 2024-02-05 DIAGNOSIS — R51 Headache with orthostatic component, not elsewhere classified: Secondary | ICD-10-CM

## 2024-02-05 MED ORDER — GADOPICLENOL 0.5 MMOL/ML IV SOLN
6.0000 mL | Freq: Once | INTRAVENOUS | Status: AC | PRN
  Administered 2024-02-05: 6 mL via INTRAVENOUS

## 2024-02-13 ENCOUNTER — Ambulatory Visit: Payer: Self-pay | Admitting: Diagnostic Neuroimaging

## 2024-02-22 NOTE — Telephone Encounter (Signed)
 Cld Pt with interpreter to relay test results from Dr. Salli Crawley - test results are good, no major findings. Continue current plan. Per interpreter Pt had no questions.

## 2024-02-22 NOTE — Telephone Encounter (Signed)
-----   Message from Omega Bible sent at 02/13/2024  5:11 PM EDT -----  ----- Message ----- From: Lisabeth Rider, MD Sent: 02/06/2024   1:43 PM EDT To: Omega Bible, MD

## 2024-03-06 ENCOUNTER — Other Ambulatory Visit: Payer: Self-pay | Admitting: Nurse Practitioner

## 2024-03-06 DIAGNOSIS — I10 Essential (primary) hypertension: Secondary | ICD-10-CM

## 2024-03-13 ENCOUNTER — Ambulatory Visit: Payer: Self-pay | Admitting: Nurse Practitioner

## 2024-03-21 DIAGNOSIS — R350 Frequency of micturition: Secondary | ICD-10-CM | POA: Diagnosis not present

## 2024-03-21 DIAGNOSIS — R300 Dysuria: Secondary | ICD-10-CM | POA: Diagnosis not present

## 2024-03-21 DIAGNOSIS — N3946 Mixed incontinence: Secondary | ICD-10-CM | POA: Diagnosis not present

## 2024-04-08 DIAGNOSIS — R350 Frequency of micturition: Secondary | ICD-10-CM | POA: Diagnosis not present

## 2024-04-08 DIAGNOSIS — R300 Dysuria: Secondary | ICD-10-CM | POA: Diagnosis not present

## 2024-04-08 DIAGNOSIS — R351 Nocturia: Secondary | ICD-10-CM | POA: Diagnosis not present

## 2024-04-16 ENCOUNTER — Ambulatory Visit: Payer: Self-pay | Admitting: Psychiatry

## 2024-04-19 ENCOUNTER — Ambulatory Visit: Payer: Self-pay | Admitting: Nurse Practitioner

## 2024-05-20 ENCOUNTER — Ambulatory Visit: Payer: Self-pay | Admitting: Nurse Practitioner

## 2024-05-20 ENCOUNTER — Encounter: Payer: Self-pay | Admitting: Nurse Practitioner

## 2024-05-20 VITALS — BP 128/55 | HR 67 | Wt 138.6 lb

## 2024-05-20 DIAGNOSIS — E559 Vitamin D deficiency, unspecified: Secondary | ICD-10-CM

## 2024-05-20 DIAGNOSIS — I10 Essential (primary) hypertension: Secondary | ICD-10-CM

## 2024-05-20 NOTE — Progress Notes (Signed)
   Subjective   Patient ID: Lauren Petersen, female    DOB: 06-25-1966, 58 y.o.   MRN: [id number]  Chief Complaint  Patient presents with   Medical Management of Chronic Issues    Referring provider: Oley Bascom RAMAN, NP  Lauren Petersen is a 58 y.o. female with Past Medical History: No date: Alopecia No date: GERD (gastroesophageal reflux disease) No date: Hypertension 07/2019: Seborrhea capitis in adult   HPI  Patient presents today to follow-up on hypertension.  Vital signs are stable in office today.  Patient states that she is compliant with medications.  Patient is following a low-salt diet but is not exercising. Denies f/c/s, n/v/d, hemoptysis, PND, leg swelling. Denies chest pain or edema.     No Known Allergies  Immunization History  Administered Date(s) Administered   Influenza, Seasonal, Injecte, Preservative Fre 09/13/2023   Influenza,inj,Quad PF,6+ Mos 07/21/2015, 07/14/2016, 09/21/2018   Tdap 09/19/2016    Tobacco History: Social History   Tobacco Use  Smoking Status Never  Smokeless Tobacco Never   Counseling given: Not Answered   Outpatient Encounter Medications as of 05/20/2024  Medication Sig   amitriptyline  (ELAVIL ) 25 MG tablet Take 1 tablet (25 mg total) by mouth at bedtime.   cholecalciferol (VITAMIN D3) 25 MCG (1000 UNIT) tablet Take 1,000 Units by mouth daily.   cromolyn (OPTICROM) 4 % ophthalmic solution 2 drops 4 (four) times daily.   hydrochlorothiazide  (HYDRODIURIL ) 25 MG tablet TAKE 1 TABLET(25 MG) BY MOUTH DAILY   Multiple Vitamin (MULTIVITAMIN PO) Take by mouth.   Vibegron (GEMTESA) 75 MG TABS Take 75 mg by mouth daily.   XIIDRA 5 % SOLN Apply 1 drop to eye 2 (two) times daily.   No facility-administered encounter medications on file as of 05/20/2024.    Review of Systems  Review of Systems  Constitutional: Negative.   HENT: Negative.    Cardiovascular: Negative.   Gastrointestinal: Negative.   Allergic/Immunologic:  Negative.   Neurological: Negative.   Psychiatric/Behavioral: Negative.       Objective:   BP (!) 128/55   Pulse 67   Wt 138 lb 9.6 oz (62.9 kg)   LMP 07/05/2020 (Exact Date)   SpO2 97%   BMI 27.99 kg/m   Wt Readings from Last 5 Encounters:  05/20/24 138 lb 9.6 oz (62.9 kg)  12/26/23 137 lb (62.1 kg)  11/14/23 136 lb (61.7 kg)  09/13/23 136 lb (61.7 kg)  03/13/23 129 lb 6.4 oz (58.7 kg)     Physical Exam Vitals and nursing note reviewed.  Constitutional:      General: She is not in acute distress.    Appearance: She is well-developed.  Cardiovascular:     Rate and Rhythm: Normal rate and regular rhythm.  Pulmonary:     Effort: Pulmonary effort is normal.     Breath sounds: Normal breath sounds.  Neurological:     Mental Status: She is alert and oriented to person, place, and time.       Assessment & Plan:   Essential hypertension -     CBC -     Comprehensive metabolic panel with GFR  Vitamin D  deficiency -     VITAMIN D  25 Hydroxy (Vit-D Deficiency, Fractures)     Return in about 6 months (around 11/20/2024).   Bascom RAMAN Oley, NP 05/20/2024

## 2024-05-21 LAB — CBC
Hematocrit: 43.6 % (ref 34.0–46.6)
Hemoglobin: 14.1 g/dL (ref 11.1–15.9)
MCH: 28.5 pg (ref 26.6–33.0)
MCHC: 32.3 g/dL (ref 31.5–35.7)
MCV: 88 fL (ref 79–97)
Platelets: 209 x10E3/uL (ref 150–450)
RBC: 4.95 x10E6/uL (ref 3.77–5.28)
RDW: 12.8 % (ref 11.7–15.4)
WBC: 6.5 x10E3/uL (ref 3.4–10.8)

## 2024-05-21 LAB — COMPREHENSIVE METABOLIC PANEL WITH GFR
ALT: 28 IU/L (ref 0–32)
AST: 22 IU/L (ref 0–40)
Albumin: 4.5 g/dL (ref 3.8–4.9)
Alkaline Phosphatase: 86 IU/L (ref 44–121)
BUN/Creatinine Ratio: 19 (ref 9–23)
BUN: 15 mg/dL (ref 6–24)
Bilirubin Total: 0.3 mg/dL (ref 0.0–1.2)
CO2: 23 mmol/L (ref 20–29)
Calcium: 10.4 mg/dL — ABNORMAL HIGH (ref 8.7–10.2)
Chloride: 100 mmol/L (ref 96–106)
Creatinine, Ser: 0.77 mg/dL (ref 0.57–1.00)
Globulin, Total: 3.3 g/dL (ref 1.5–4.5)
Glucose: 99 mg/dL (ref 70–99)
Potassium: 3.5 mmol/L (ref 3.5–5.2)
Sodium: 138 mmol/L (ref 134–144)
Total Protein: 7.8 g/dL (ref 6.0–8.5)
eGFR: 90 mL/min/1.73 (ref >59)

## 2024-05-21 LAB — VITAMIN D 25 HYDROXY (VIT D DEFICIENCY, FRACTURES): Vit D, 25-Hydroxy: 35.9 ng/mL (ref 30.0–100.0)

## 2024-05-22 ENCOUNTER — Ambulatory Visit: Payer: Self-pay | Admitting: Nurse Practitioner

## 2024-07-15 ENCOUNTER — Ambulatory Visit: Admitting: Diagnostic Neuroimaging

## 2024-07-18 DIAGNOSIS — N3281 Overactive bladder: Secondary | ICD-10-CM | POA: Diagnosis not present

## 2024-11-20 ENCOUNTER — Ambulatory Visit: Payer: Self-pay | Admitting: Nurse Practitioner
